# Patient Record
Sex: Female | Born: 1960 | Race: Black or African American | Hispanic: No | Marital: Married | Smoking: Never smoker
Health system: Southern US, Community
[De-identification: ages and names within clinical notes are randomized; demographics above are authoritative.]

## PROBLEM LIST (undated history)

## (undated) ENCOUNTER — Emergency Department (HOSPITAL_COMMUNITY): Discharge status: Absent without leave | Payer: Self-pay

## (undated) DIAGNOSIS — E039 Hypothyroidism, unspecified: Secondary | ICD-10-CM

## (undated) DIAGNOSIS — E119 Type 2 diabetes mellitus without complications: Secondary | ICD-10-CM

## (undated) DIAGNOSIS — I10 Essential (primary) hypertension: Secondary | ICD-10-CM

## (undated) DIAGNOSIS — K219 Gastro-esophageal reflux disease without esophagitis: Secondary | ICD-10-CM

## (undated) DIAGNOSIS — E079 Disorder of thyroid, unspecified: Secondary | ICD-10-CM

## (undated) DIAGNOSIS — E669 Obesity, unspecified: Secondary | ICD-10-CM

## (undated) DIAGNOSIS — E049 Nontoxic goiter, unspecified: Secondary | ICD-10-CM

## (undated) DIAGNOSIS — E785 Hyperlipidemia, unspecified: Secondary | ICD-10-CM

## (undated) DIAGNOSIS — E1169 Type 2 diabetes mellitus with other specified complication: Secondary | ICD-10-CM

## (undated) DIAGNOSIS — E1159 Type 2 diabetes mellitus with other circulatory complications: Secondary | ICD-10-CM

## (undated) DIAGNOSIS — I152 Hypertension secondary to endocrine disorders: Secondary | ICD-10-CM

## (undated) HISTORY — PX: THYROIDECTOMY: SHX17

## (undated) HISTORY — PX: RHINOPLASTY: SUR1284

## (undated) HISTORY — DX: Type 2 diabetes mellitus with other circulatory complications: E11.59

## (undated) HISTORY — DX: Hypertension secondary to endocrine disorders: I15.2

## (undated) HISTORY — DX: Hyperlipidemia, unspecified: E78.5

## (undated) HISTORY — DX: Obesity, unspecified: E66.9

## (undated) HISTORY — DX: Hypothyroidism, unspecified: E03.9

## (undated) HISTORY — DX: Essential (primary) hypertension: I10

## (undated) HISTORY — DX: Type 2 diabetes mellitus with other specified complication: E11.69

## (undated) HISTORY — PX: TUBAL LIGATION: SHX77

## (undated) HISTORY — PX: ABDOMINAL HYSTERECTOMY: SHX81

## (undated) HISTORY — DX: Gastro-esophageal reflux disease without esophagitis: K21.9

## (undated) HISTORY — DX: Nontoxic goiter, unspecified: E04.9

---

## 1998-08-25 ENCOUNTER — Other Ambulatory Visit: Admission: RE | Admit: 1998-08-25 | Discharge: 1998-08-25 | Payer: Self-pay | Admitting: *Deleted

## 1999-10-11 ENCOUNTER — Other Ambulatory Visit: Admission: RE | Admit: 1999-10-11 | Discharge: 1999-10-11 | Payer: Self-pay | Admitting: *Deleted

## 2000-03-14 ENCOUNTER — Inpatient Hospital Stay (HOSPITAL_COMMUNITY): Admission: AD | Admit: 2000-03-14 | Discharge: 2000-03-14 | Payer: Self-pay | Admitting: *Deleted

## 2004-04-11 ENCOUNTER — Ambulatory Visit: Payer: Self-pay | Admitting: Internal Medicine

## 2004-04-18 ENCOUNTER — Ambulatory Visit: Payer: Self-pay | Admitting: Internal Medicine

## 2004-04-21 ENCOUNTER — Other Ambulatory Visit: Admission: RE | Admit: 2004-04-21 | Discharge: 2004-04-21 | Payer: Self-pay | Admitting: Obstetrics and Gynecology

## 2004-05-18 ENCOUNTER — Inpatient Hospital Stay (HOSPITAL_COMMUNITY): Admission: RE | Admit: 2004-05-18 | Discharge: 2004-05-20 | Payer: Self-pay | Admitting: Obstetrics and Gynecology

## 2004-05-18 ENCOUNTER — Encounter (INDEPENDENT_AMBULATORY_CARE_PROVIDER_SITE_OTHER): Payer: Self-pay | Admitting: Specialist

## 2006-02-06 ENCOUNTER — Inpatient Hospital Stay (HOSPITAL_COMMUNITY): Admission: EM | Admit: 2006-02-06 | Discharge: 2006-02-08 | Payer: Self-pay | Admitting: Emergency Medicine

## 2007-05-02 ENCOUNTER — Ambulatory Visit: Payer: Self-pay | Admitting: Internal Medicine

## 2007-05-02 DIAGNOSIS — N951 Menopausal and female climacteric states: Secondary | ICD-10-CM | POA: Insufficient documentation

## 2007-05-02 DIAGNOSIS — F430 Acute stress reaction: Secondary | ICD-10-CM | POA: Insufficient documentation

## 2007-05-02 DIAGNOSIS — E785 Hyperlipidemia, unspecified: Secondary | ICD-10-CM | POA: Insufficient documentation

## 2007-05-02 DIAGNOSIS — E119 Type 2 diabetes mellitus without complications: Secondary | ICD-10-CM | POA: Insufficient documentation

## 2007-05-02 DIAGNOSIS — D259 Leiomyoma of uterus, unspecified: Secondary | ICD-10-CM | POA: Insufficient documentation

## 2007-05-17 ENCOUNTER — Ambulatory Visit: Payer: Self-pay | Admitting: Licensed Clinical Social Worker

## 2007-11-15 ENCOUNTER — Ambulatory Visit: Payer: Self-pay | Admitting: Licensed Clinical Social Worker

## 2007-11-21 ENCOUNTER — Ambulatory Visit: Payer: Self-pay | Admitting: Licensed Clinical Social Worker

## 2009-05-06 ENCOUNTER — Encounter: Admission: RE | Admit: 2009-05-06 | Discharge: 2009-05-06 | Payer: Self-pay | Admitting: Endocrinology

## 2009-05-24 ENCOUNTER — Encounter (HOSPITAL_COMMUNITY): Admission: RE | Admit: 2009-05-24 | Discharge: 2009-07-27 | Payer: Self-pay | Admitting: Endocrinology

## 2009-06-24 ENCOUNTER — Other Ambulatory Visit: Admission: RE | Admit: 2009-06-24 | Discharge: 2009-06-24 | Payer: Self-pay | Admitting: Interventional Radiology

## 2009-06-24 ENCOUNTER — Encounter: Admission: RE | Admit: 2009-06-24 | Discharge: 2009-06-24 | Payer: Self-pay | Admitting: Endocrinology

## 2010-04-29 ENCOUNTER — Other Ambulatory Visit: Payer: Self-pay | Admitting: Endocrinology

## 2010-04-29 DIAGNOSIS — E049 Nontoxic goiter, unspecified: Secondary | ICD-10-CM

## 2010-05-30 ENCOUNTER — Ambulatory Visit
Admission: RE | Admit: 2010-05-30 | Discharge: 2010-05-30 | Disposition: A | Discharge status: Home / Self Care | Payer: BC Managed Care – PPO | Source: Ambulatory Visit | Attending: Endocrinology | Admitting: Endocrinology

## 2010-05-30 DIAGNOSIS — E049 Nontoxic goiter, unspecified: Secondary | ICD-10-CM

## 2010-08-12 NOTE — H&P (Signed)
Brenda Fleming, Brenda Fleming               ACCOUNT NO.:  [id number]   MEDICAL RECORD NO.:  [id number]          PATIENT TYPE:  INP   LOCATION:  NA                            FACILITY:  [facility]   PHYSICIAN:  Zenaida Niece, M.D.DATE OF BIRTH:  01/22/1961   DATE OF ADMISSION:  05/18/2004  DATE OF DISCHARGE:                                HISTORY & PHYSICAL   CHIEF COMPLAINT:  Symptomatic leiomyomatous uterus.   HISTORY OF PRESENT ILLNESS:  This is a 50 year old black female, para 2-0-0-  2, whom I first saw on April 21, 2004.  At that time, she said that she  had menses every 1-2 months, which have recently been heavier with clotting  during her cycle.  The clots can be so big that they push out a super  tampon.  She is sexually active without problems, and has no other  significant symptoms, except some occasional pelvic pressure.  Pelvic exam  revealed an approximately 16 week size uterus.  Endometrial biopsy revealed  proliferative endometrium with a focus of simple hyperplasia without atypia.  Pap smear was normal.  All options were discussed with the patient, and she  wishes to proceed with definitive surgical therapy.   PAST OBSTETRICAL HISTORY:  Two vaginal deliveries at term without  complications, 9 pounds, 14 ounces, and 10 pound, 1 ounces.   PAST MEDICAL HISTORY:  Negative.   PAST SURGICAL HISTORY:  Tubal ligation.   ALLERGIES:  SULFA.   CURRENT MEDICATIONS:  1.  Over-the-counter sinus remedy.  2.  Aspirin occasional.  3.  Cold and Flu occasionally.  4.  Occasional vitamin.  5.  She has been on Aygestin 5 mg a day from the time I saw her until      surgery.   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   SOCIAL HISTORY:  She is married and denies alcohol, tobacco, or drug use.   FAMILY HISTORY:  The patient's mother had colon cancer at age 58.   REVIEW OF SYSTEMS:  Otherwise negative.   PHYSICAL EXAMINATION:   VITAL SIGNS:  Weight is 207 pounds, pulse 80, blood  pressure 148/90.  GENERAL:  This is a well-developed, well-nourished black female in no acute  distress.  NECK:  Supple without lymphadenopathy or thyromegaly.  HEART:  Regular rate and rhythm without murmur.  LUNGS:  Clear to auscultation.  ABDOMEN:  Soft, nontender, nondistended, with the uterine fundus palpable 2  fingerbreadths below the umbilicus.  EXTREMITIES:  Trace edema and are nontender.  PELVIC:  External genitalia have no lesions.  Speculum exam showed a normal  cervix.  Bimanual exam shows a 16-week size irregular nontender uterus  without adnexal masses.   ASSESSMENT:  Symptomatic leiomyomatous uterus.  All options were discussed  with the patient, and she wishes to proceed with definitive surgical  therapy.  Risks of surgery including bleeding, infection, and damage to the  surrounding organs have been discussed with the patient, and she  understands.  The plan is to admit the patient on the day  of surgery for an  abdominal hysterectomy.  The patient is to decide whether or not she wants  her ovaries removed.      TDM/MEDQ  D:  05/17/2004  T:  05/17/2004  Job:  [id number]

## 2010-08-12 NOTE — H&P (Signed)
Brenda Fleming, SILOS NO.:  [id number]   MEDICAL RECORD NO.:  [id number]          PATIENT TYPE:  INP   LOCATION:  5735                         FACILITY:  [facility]   PHYSICIAN:  Lucita Ferrara, MD         DATE OF BIRTH:  09-Sep-1960   DATE OF ADMISSION:  02/06/2006  DATE OF DISCHARGE:                                HISTORY & PHYSICAL   HISTORY OF PRESENT ILLNESS:  The patient is a 50 year old female with no  significant past medical history, who presents to [facility] Emergency Room  with chief complaint of not feeling well for the last few days.  Apparently  what happened is the patient presented to the [facility]  at [facility] on February 06, 2006 and had lab work done indicative of  diabetic ketoacidosis including CO2 that was 10, a sodium that was 129, a  glucose that was 373 with a gap.  Alkaline phosphatase was also 206.  The  patient was advised that she should go to the emergency room for evaluation  and treatment.  The patient's urinalysis also showed ketones and glucose,  1000 glucose and 80 mg/dL of ketones.  The patient says that she has been  urinating a lot and she has been having some blurry vision, yet she denies  any focal neurologic signs and she denies any chest pain or shortness of  breath.   PAST MEDICAL HISTORY:  As above, past medical history has been pretty much  none.   PAST SURGICAL HISTORY:  None.   SOCIAL HISTORY:  She denies any drugs.  She is a nonsmoker and a nondrinker.   ALLERGIES:  The patient is allergic to SULFA MEDICATIONS.   MEDICATIONS:  None.   REVIEW OF SYSTEMS:  As above.   PHYSICAL EXAMINATION:  VITAL SIGNS:  Blood pressure is 137/94, pulse is 108,  respirations 20.  Pulse oximetry is 99% on room air.  GENERAL:  Generally speaking, the patient is in no acute distress.  HEENT:  Normocephalic, atraumatic.  Sclerae anicteric.  NECK:  No JVD.  No carotid bruits.  CARDIOVASCULAR:  S1 and S2.  Regular  rate and rhythm.  No murmurs, rubs or  clicks.  ABDOMEN:  Soft, nontender and non-distended.  Positive bowel sounds.  LUNGS:  Clear to auscultation bilaterally.  No rhonchi, no wheezes, no  rales.  EXTREMITIES:  No clubbing, cyanosis or edema.  NEUROLOGIC:  Alert and oriented x3.  Cranial nerves II-XII grossly intact.   LABORATORY DATA:  Glucose was greater than 1000, ketones greater than 80.  White count 6.4, hemoglobin 15.4, hematocrit 45.9, platelet count was  288,000.   EMERGENCY ROOM COURSE:  The patient was started on bolus of IV fluids and  was also given 10 units of NovoLog in the emergency room.   EKG was normal sinus rhythm, no ST-T wave changes.   ASSESSMENT AND PLAN:  This is a 50 year old female, a new-onset diabetic.  We are going to go ahead and admit her for high blood sugars and diabetic  ketoacidosis.  She is  very, very stable.  I am going to go ahead and get  arterial blood gases on her, start her on intravenous fluids and put her on  sliding-scale insulin.  In addition, I would like to get magnesium and  phosphorus and repeat her basic metabolic panel including potassium in the  next 3 or 4 hours to make sure that her potassium is within the normal  range.  We will repeat electrolytes as needed.  The patient will need some  diabetic teaching.  I have explained the hospital course and procedures to  the patient and the patient understands.      Lucita Ferrara, MD  Electronically Signed     RR/MEDQ  D:  02/07/2006  T:  02/07/2006  Job:  [id number]

## 2010-08-12 NOTE — Op Note (Signed)
NAMESHI, GROSE               ACCOUNT NO.:  [id number]   MEDICAL RECORD NO.:  [id number]          PATIENT TYPE:  INP   LOCATION:  9399                          FACILITY:  [facility]   PHYSICIAN:  Zenaida Niece, M.D.DATE OF BIRTH:  [birth year]   DATE OF PROCEDURE:  05/18/2004  DATE OF DISCHARGE:                                 OPERATIVE REPORT   PREOPERATIVE DIAGNOSIS:  Symptomatic leiomyomatous uterus.   POSTOPERATIVE DIAGNOSIS:  Symptomatic leiomyomatous uterus.   PROCEDURE:  Total abdominal hysterectomy with bilateral salpingo-  oophorectomy.   SURGEON:  Zenaida Niece, M.D.   ASSISTANT:  Malachi Pro. Ambrose Mantle, M.D.   ANESTHESIA:  General endotracheal tube.   SPECIMENS:  Uterus, tubes and ovaries.   ESTIMATED BLOOD LOSS:  200 mL.   FINDINGS:  A 14 week-size uterus with a simple right ovarian cyst.   PROCEDURE IN DETAIL:  The patient was taken to the operating room, placed on  dorsal supine position. General anesthesia was induced. Abdomen and vagina  were prepped and draped in the usual sterile fashion and Foley catheter  inserted. Abdomen was then entered via standard Pfannenstiel incision and a  self-retaining retractor was placed and the bowels packed out of the pelvis.  The uterus was noted to be 14 weeks size and freely mobile. Uterine cornu  were grasped with a large Kelly clamp. The round ligaments were divided with  electrocautery and the anterior lip of the broad ligament was dissected  across the anterior portion of the uterus and bladder pushed inferior. On  each side a window was made in an avascular portion of the broad ligament  and the infundibulopelvic ligament was clamped, transected and doubly  ligated with #1 chromic. Uterine arteries were then skeletonized and clamped  with Zeppelin clamps and transected and ligated with #1 chromic. Bladder was  dissected inferior and cardinal ligaments, uterosacral ligaments were  clamped, transected and ligated on  each side. Uterosacral ligaments were  tagged for later use. Several bites were required on the patient's right  side to get to the vaginal angle. The vagina was then entered and the uterus  with tubes and ovaries attached and cervix intact was removed sharply.  Vaginal angles were sutured with #1 chromic and tagged for later use. The  remainder of the vagina was closed with figure-of-eight sutures of #1  chromic. All pedicles were inspected and found to be hemostatic. Ureters  were palpated well below suture lines. Small amount of bleeders from serosal  edges was controlled with electrocautery. Uterosacral ligaments were  plicated in the midline with 2-0 silk. In controlling one of the serosal  bleeders, there was a loop of bowel in the field and this was very  superficially touched with electrocautery. This area was oversewn with a  suture of 3-0 silk. There was very minimal damage to the bowel. The pelvis  was irrigated and found to be hemostatic. The pack was removed followed by a  self-retaining retractor. Her peritoneum and rectus muscles were then closed  with interrupted sutures of #1 chromic due to omentum and bowel prolapsing  through  the incision. Subfascial space was irrigated and found to be  hemostatic. Fascia was closed in running fashion starting at both ends and  meeting in the middle with 0 Vicryl. Subcutaneous tissue was irrigated and  made hemostatic with electrocautery. Subcutaneous tissues then closed with  2-0 plain gut suture on a large needle. Skin was closed with staples. The  patient tolerated the procedure well and was taken to recovery in stable  condition. Counts were correct x2, she received Ancef 1 g  prior to the  procedure, she had PAS hose on throughout the procedure.      TDM/MEDQ  D:  05/18/2004  T:  05/18/2004  Job:  [id number]

## 2010-08-12 NOTE — Discharge Summary (Signed)
Brenda Fleming, SPENGLER               ACCOUNT NO.:  [id number]   MEDICAL RECORD NO.:  [id number]          PATIENT TYPE:  INP   LOCATION:  9320                          FACILITY:  [facility]   PHYSICIAN:  Zenaida Niece, M.D.DATE OF BIRTH:  05-06-1960   DATE OF ADMISSION:  05/18/2004  DATE OF DISCHARGE:                                 DISCHARGE SUMMARY   ADMISSION DIAGNOSIS:  Symptomatic leiomyomatous uterus.   DISCHARGE DIAGNOSIS:  Symptomatic leiomyomatous uterus.   PROCEDURES:  On May 18, 2004 she had a total abdominal hysterectomy  with bilateral salpingo-oophorectomy.   COMPLICATIONS:  None.   HISTORY AND PHYSICAL:  Please see chart for full history and physical but  briefly, this is a 50 year old black female para 2-0-0-2 with heavy menses  every 1-2 months with heavy clots. She had an enlarged myomatous uterus by  ultrasound and a benign endometrial biopsy with some simple hyperplasia. The  patient is admitted for definitive surgical therapy. Physical exam  significant for a benign abdomen with the uterine fundus palpable two  fingerbreadths below the umbilicus and on pelvic exam, external genitalia  and cervix and vagina were normal. On bimanual exam she had a 16-week-size  irregular uterus without adnexal masses.   HOSPITAL COURSE:  On February 22, she was taken to the operating room and  had a TAH/BSO under general anesthesia. Estimated blood loss was 200 mL.  Postoperatively, she had no significant complications. Preoperative  hemoglobin 10.8, postoperative 10.7. On postoperative day #2 she was  tolerating a diet, ambulating, and felt to be stable enough for discharge  home.   DISCHARGE INSTRUCTIONS:  Regular diet, pelvic rest, no strenuous activity.  Follow-up is in 3-4 days for staple removal. Medications are Percocet #40  one to two p.o. q.4-6h. p.r.n. pain and over-the-counter ibuprofen as  needed.      TDM/MEDQ  D:  05/20/2004  T:  05/20/2004  Job:  [id number]

## 2010-12-01 ENCOUNTER — Other Ambulatory Visit: Payer: Self-pay | Admitting: Endocrinology

## 2010-12-01 DIAGNOSIS — E049 Nontoxic goiter, unspecified: Secondary | ICD-10-CM

## 2010-12-15 ENCOUNTER — Ambulatory Visit
Admission: RE | Admit: 2010-12-15 | Discharge: 2010-12-15 | Disposition: A | Discharge status: Home / Self Care | Payer: BC Managed Care – PPO | Source: Ambulatory Visit | Attending: Endocrinology | Admitting: Endocrinology

## 2010-12-15 DIAGNOSIS — E049 Nontoxic goiter, unspecified: Secondary | ICD-10-CM

## 2011-07-05 ENCOUNTER — Other Ambulatory Visit: Payer: Self-pay | Admitting: Endocrinology

## 2011-07-05 DIAGNOSIS — E049 Nontoxic goiter, unspecified: Secondary | ICD-10-CM

## 2011-07-11 ENCOUNTER — Other Ambulatory Visit: Payer: BC Managed Care – PPO

## 2011-07-18 ENCOUNTER — Ambulatory Visit
Admission: RE | Admit: 2011-07-18 | Discharge: 2011-07-18 | Disposition: A | Discharge status: Home / Self Care | Payer: BC Managed Care – PPO | Source: Ambulatory Visit | Attending: Endocrinology | Admitting: Endocrinology

## 2011-07-18 DIAGNOSIS — E049 Nontoxic goiter, unspecified: Secondary | ICD-10-CM

## 2012-02-02 ENCOUNTER — Encounter (HOSPITAL_COMMUNITY): Payer: Self-pay

## 2012-02-02 ENCOUNTER — Inpatient Hospital Stay (HOSPITAL_COMMUNITY)
Admission: EM | Admit: 2012-02-02 | Discharge: 2012-02-04 | DRG: 294 | Disposition: A | Discharge status: Home / Self Care | Payer: BC Managed Care – PPO | Attending: Internal Medicine | Admitting: Internal Medicine

## 2012-02-02 DIAGNOSIS — E039 Hypothyroidism, unspecified: Secondary | ICD-10-CM | POA: Diagnosis present

## 2012-02-02 DIAGNOSIS — E119 Type 2 diabetes mellitus without complications: Secondary | ICD-10-CM | POA: Diagnosis present

## 2012-02-02 DIAGNOSIS — E049 Nontoxic goiter, unspecified: Secondary | ICD-10-CM | POA: Diagnosis present

## 2012-02-02 DIAGNOSIS — E1110 Type 2 diabetes mellitus with ketoacidosis without coma: Secondary | ICD-10-CM | POA: Diagnosis present

## 2012-02-02 DIAGNOSIS — F430 Acute stress reaction: Secondary | ICD-10-CM

## 2012-02-02 DIAGNOSIS — Z9119 Patient's noncompliance with other medical treatment and regimen: Secondary | ICD-10-CM

## 2012-02-02 DIAGNOSIS — E785 Hyperlipidemia, unspecified: Secondary | ICD-10-CM | POA: Diagnosis present

## 2012-02-02 DIAGNOSIS — Z91199 Patient's noncompliance with other medical treatment and regimen due to unspecified reason: Secondary | ICD-10-CM

## 2012-02-02 DIAGNOSIS — Z79899 Other long term (current) drug therapy: Secondary | ICD-10-CM

## 2012-02-02 DIAGNOSIS — E1310 Other specified diabetes mellitus with ketoacidosis without coma: Principal | ICD-10-CM | POA: Diagnosis present

## 2012-02-02 DIAGNOSIS — D259 Leiomyoma of uterus, unspecified: Secondary | ICD-10-CM

## 2012-02-02 DIAGNOSIS — N951 Menopausal and female climacteric states: Secondary | ICD-10-CM

## 2012-02-02 DIAGNOSIS — Z7982 Long term (current) use of aspirin: Secondary | ICD-10-CM

## 2012-02-02 HISTORY — DX: Disorder of thyroid, unspecified: E07.9

## 2012-02-02 HISTORY — DX: Type 2 diabetes mellitus without complications: E11.9

## 2012-02-02 LAB — URINALYSIS, ROUTINE W REFLEX MICROSCOPIC
Bilirubin Urine: NEGATIVE
Glucose, UA: >1000 mg/dL — AB
Hgb urine dipstick: NEGATIVE
Ketones, ur: >80 mg/dL — AB
Leukocytes, UA: NEGATIVE
Nitrite: NEGATIVE
Protein, ur: NEGATIVE mg/dL
Specific Gravity, Urine: 1.036 — ABNORMAL HIGH (ref 1.005–1.030)
Urobilinogen, UA: 0.2 mg/dL (ref 0.0–1.0)
pH: 5 (ref 5.0–8.0)

## 2012-02-02 LAB — BASIC METABOLIC PANEL
BUN: 13 mg/dL (ref 6–23)
CO2: 19 mEq/L (ref 19–32)
Calcium: 9.5 mg/dL (ref 8.4–10.5)
Chloride: 91 mEq/L — ABNORMAL LOW (ref 96–112)
Creatinine, Ser: 0.63 mg/dL (ref 0.50–1.10)
GFR calc Af Amer: >90 mL/min (ref >90)
GFR calc non Af Amer: >90 mL/min (ref >90)
Sodium: 128 mEq/L — ABNORMAL LOW (ref 135–145)

## 2012-02-02 LAB — CBC
HCT: 40.7 % (ref 36.0–46.0)
Hemoglobin: 13.9 g/dL (ref 12.0–15.0)
MCH: 24.8 pg — ABNORMAL LOW (ref 26.0–34.0)
MCHC: 34.2 g/dL (ref 30.0–36.0)
MCV: 72.5 fL — ABNORMAL LOW (ref 78.0–100.0)
Platelets: 278 10*3/uL (ref 150–400)
RBC: 5.61 MIL/uL — ABNORMAL HIGH (ref 3.87–5.11)
RDW: 13.7 % (ref 11.5–15.5)
WBC: 6.9 10*3/uL (ref 4.0–10.5)

## 2012-02-02 LAB — GLUCOSE, CAPILLARY
Glucose-Capillary: 386 mg/dL — ABNORMAL HIGH (ref 70–99)
Glucose-Capillary: 316 mg/dL — ABNORMAL HIGH (ref 70–99)

## 2012-02-02 NOTE — ED Notes (Addendum)
Pt has been seeing PMD Q35months.  Last HgbA1c was fine around 6 months ago so stopped taking CBG's (she is DM 2).  Has been having leg cramps w/30lb weight loss since end of August of this year.  Thought she had lost appetite d/t being on allergy/asthma meds.  Her leg cramps have been worsening, she has been drinking vitamin waters to increase her potassium levels to help.  She took her CBG last night & states it was 580 then.  She took 2 metformin tabs and dropped CBG to 300's.  Today it was up to 511 around 430pm. Has had a total of 5, 500mg  metformin tabs today.  Currently CBG 386

## 2012-02-03 ENCOUNTER — Inpatient Hospital Stay (HOSPITAL_COMMUNITY): Payer: BC Managed Care – PPO

## 2012-02-03 DIAGNOSIS — E049 Nontoxic goiter, unspecified: Secondary | ICD-10-CM | POA: Diagnosis present

## 2012-02-03 DIAGNOSIS — E1110 Type 2 diabetes mellitus with ketoacidosis without coma: Secondary | ICD-10-CM | POA: Diagnosis present

## 2012-02-03 DIAGNOSIS — E119 Type 2 diabetes mellitus without complications: Secondary | ICD-10-CM

## 2012-02-03 LAB — GLUCOSE, CAPILLARY
Glucose-Capillary: 223 mg/dL — ABNORMAL HIGH (ref 70–99)
Glucose-Capillary: 107 mg/dL — ABNORMAL HIGH (ref 70–99)
Glucose-Capillary: 323 mg/dL — ABNORMAL HIGH (ref 70–99)
Glucose-Capillary: 284 mg/dL — ABNORMAL HIGH (ref 70–99)

## 2012-02-03 LAB — MAGNESIUM: Magnesium: 1.8 mg/dL (ref 1.5–2.5)

## 2012-02-03 LAB — BASIC METABOLIC PANEL
CO2: 20 mEq/L (ref 19–32)
Calcium: 8.2 mg/dL — ABNORMAL LOW (ref 8.4–10.5)
Creatinine, Ser: 0.56 mg/dL (ref 0.50–1.10)
Glucose, Bld: 179 mg/dL — ABNORMAL HIGH (ref 70–99)
Sodium: 134 mEq/L — ABNORMAL LOW (ref 135–145)

## 2012-02-03 LAB — CBC
MCH: 24.3 pg — ABNORMAL LOW (ref 26.0–34.0)
MCV: 73.9 fL — ABNORMAL LOW (ref 78.0–100.0)
Platelets: 236 10*3/uL (ref 150–400)
RBC: 4.94 MIL/uL (ref 3.87–5.11)

## 2012-02-03 LAB — HEMOGLOBIN A1C
Hgb A1c MFr Bld: 16.3 % — ABNORMAL HIGH (ref <5.7)
Mean Plasma Glucose: 421 mg/dL — ABNORMAL HIGH (ref <117)

## 2012-02-03 MED ORDER — SODIUM CHLORIDE 0.9 % IV SOLN
INTRAVENOUS | Status: DC
  Administered 2012-02-03: via INTRAVENOUS

## 2012-02-03 MED ORDER — SODIUM CHLORIDE 0.9 % IV SOLN
INTRAVENOUS | Status: DC
Start: 2012-02-03 — End: 2012-02-03

## 2012-02-03 MED ORDER — ENOXAPARIN SODIUM 40 MG/0.4ML ~~LOC~~ SOLN: 40.0000 mg | SUBCUTANEOUS | Status: DC

## 2012-02-03 MED ORDER — ASPIRIN EC 325 MG PO TBEC
325.0000 mg | DELAYED_RELEASE_TABLET | Freq: Every day | ORAL | Status: DC
  Administered 2012-02-03: 325 mg via ORAL
  Filled 2012-02-03 (×2): qty 1

## 2012-02-03 MED ORDER — LIVING WELL WITH DIABETES BOOK
Freq: Once | Status: AC
  Administered 2012-02-04: 10:00:00
  Filled 2012-02-03: qty 1

## 2012-02-03 MED ORDER — ACETAMINOPHEN 650 MG RE SUPP: 650.0000 mg | Freq: Four times a day (QID) | RECTAL | Status: DC | PRN

## 2012-02-03 MED ORDER — SODIUM CHLORIDE 0.9 % IV SOLN: INTRAVENOUS | Status: AC

## 2012-02-03 MED ORDER — ONDANSETRON HCL 4 MG/2ML IJ SOLN: 4.0000 mg | Freq: Four times a day (QID) | INTRAMUSCULAR | Status: DC | PRN

## 2012-02-03 MED ORDER — ALUM & MAG HYDROXIDE-SIMETH 200-200-20 MG/5ML PO SUSP: 30.0000 mL | Freq: Four times a day (QID) | ORAL | Status: DC | PRN

## 2012-02-03 MED ORDER — ADULT MULTIVITAMIN W/MINERALS CH
1.0000 | ORAL_TABLET | Freq: Every day | ORAL | Status: DC
  Administered 2012-02-03 – 2012-02-04 (×2): 1 via ORAL
  Filled 2012-02-03 (×2): qty 1

## 2012-02-03 MED ORDER — DEXTROSE-NACL 5-0.45 % IV SOLN
INTRAVENOUS | Status: DC
  Administered 2012-02-03: 03:00:00 via INTRAVENOUS

## 2012-02-03 MED ORDER — SODIUM CHLORIDE 0.9 % IV SOLN
INTRAVENOUS | Status: DC
  Filled 2012-02-03: qty 1

## 2012-02-03 MED ORDER — ENOXAPARIN SODIUM 40 MG/0.4ML ~~LOC~~ SOLN
40.0000 mg | SUBCUTANEOUS | Status: DC
  Administered 2012-02-03 – 2012-02-04 (×2): 40 mg via SUBCUTANEOUS
  Filled 2012-02-03 (×2): qty 0.4

## 2012-02-03 MED ORDER — ACETAMINOPHEN 325 MG PO TABS: 650.0000 mg | ORAL_TABLET | Freq: Four times a day (QID) | ORAL | Status: DC | PRN

## 2012-02-03 MED ORDER — SODIUM CHLORIDE 0.9 % IV SOLN
INTRAVENOUS | Status: DC
  Administered 2012-02-03: 02:00:00 via INTRAVENOUS

## 2012-02-03 MED ORDER — INSULIN GLARGINE 100 UNIT/ML ~~LOC~~ SOLN
6.0000 [IU] | Freq: Every day | SUBCUTANEOUS | Status: DC
  Administered 2012-02-03: 6 [IU] via SUBCUTANEOUS

## 2012-02-03 MED ORDER — ZOLPIDEM TARTRATE 5 MG PO TABS: 5.0000 mg | ORAL_TABLET | Freq: Every evening | ORAL | Status: DC | PRN

## 2012-02-03 MED ORDER — SODIUM CHLORIDE 0.9 % IV SOLN: INTRAVENOUS | Status: DC

## 2012-02-03 MED ORDER — DEXTROSE 50 % IV SOLN: 25.0000 mL | INTRAVENOUS | Status: DC | PRN

## 2012-02-03 MED ORDER — ATORVASTATIN CALCIUM 40 MG PO TABS
40.0000 mg | ORAL_TABLET | Freq: Every day | ORAL | Status: DC
  Administered 2012-02-03 – 2012-02-04 (×2): 40 mg via ORAL
  Filled 2012-02-03 (×2): qty 1

## 2012-02-03 MED ORDER — HYDROCODONE-ACETAMINOPHEN 5-325 MG PO TABS: 1.0000 | ORAL_TABLET | ORAL | Status: DC | PRN

## 2012-02-03 MED ORDER — DEXTROSE-NACL 5-0.45 % IV SOLN: INTRAVENOUS | Status: DC

## 2012-02-03 MED ORDER — INSULIN GLARGINE 100 UNIT/ML ~~LOC~~ SOLN: 6.0000 [IU] | Freq: Every day | SUBCUTANEOUS | Status: DC

## 2012-02-03 MED ORDER — INSULIN ASPART 100 UNIT/ML ~~LOC~~ SOLN
0.0000 [IU] | Freq: Three times a day (TID) | SUBCUTANEOUS | Status: DC
  Administered 2012-02-03 – 2012-02-04 (×3): 8 [IU] via SUBCUTANEOUS
  Administered 2012-02-04: 15 [IU] via SUBCUTANEOUS

## 2012-02-03 MED ORDER — LISINOPRIL 10 MG PO TABS
10.0000 mg | ORAL_TABLET | Freq: Every day | ORAL | Status: DC
  Administered 2012-02-03 – 2012-02-04 (×2): 10 mg via ORAL
  Filled 2012-02-03 (×2): qty 1

## 2012-02-03 MED ORDER — INSULIN GLARGINE 100 UNIT/ML ~~LOC~~ SOLN
6.0000 [IU] | SUBCUTANEOUS | Status: AC
  Administered 2012-02-03: 6 [IU] via SUBCUTANEOUS

## 2012-02-03 MED ORDER — MORPHINE SULFATE 2 MG/ML IJ SOLN: 1.0000 mg | INTRAMUSCULAR | Status: DC | PRN

## 2012-02-03 MED ORDER — SODIUM CHLORIDE 0.9 % IV SOLN
INTRAVENOUS | Status: DC
  Administered 2012-02-03: 2.1 [IU]/h via INTRAVENOUS
  Filled 2012-02-03: qty 1

## 2012-02-03 MED ORDER — ONDANSETRON HCL 4 MG PO TABS: 4.0000 mg | ORAL_TABLET | Freq: Four times a day (QID) | ORAL | Status: DC | PRN

## 2012-02-03 MED ORDER — INSULIN REGULAR BOLUS VIA INFUSION
0.0000 [IU] | Freq: Three times a day (TID) | INTRAVENOUS | Status: DC
  Administered 2012-02-03: 7.2 [IU] via INTRAVENOUS
  Filled 2012-02-03: qty 10

## 2012-02-03 NOTE — ED Provider Notes (Signed)
History     CSN: [id number]  Arrival date & time 02/02/12  1813   First MD Initiated Contact with Patient 02/02/12 2306      Chief Complaint  Patient presents with  . Hyperglycemia  . leg cramps     (Consider location/radiation/quality/duration/timing/severity/associated sxs/prior treatment) Patient is a 51 y.o. female presenting with diabetes problem. The history is provided by the patient. No language interpreter was used.  Diabetes She has type 2 diabetes mellitus. No MedicAlert identification noted. Her disease course has been worsening. Pertinent negatives for hypoglycemia include no sleepiness. Associated symptoms include polydipsia and weight loss. Pertinent negatives for diabetes include no blurred vision, no chest pain and no fatigue. Pertinent negatives for hypoglycemia complications include no hospitalization. Symptoms are worsening. Pertinent negatives for diabetic complications include no CVA. Risk factors for coronary artery disease include diabetes mellitus. Current diabetic treatment includes oral agent (monotherapy). She is compliant with treatment all of the time. She is following a generally healthy diet. When asked about meal planning, she reported none. Home blood sugar record trend: unknown has not checked it since may. An ACE inhibitor/angiotensin II receptor blocker is being taken.    Past Medical History  Diagnosis Date  . Diabetes mellitus without complication     Type 2  . Thyroid disease     hypothyroid    Past Surgical History  Procedure Date  . Abdominal hysterectomy   . Tubal ligation     History reviewed. No pertinent family history.  History  Substance Use Topics  . Smoking status: Never Smoker   . Smokeless tobacco: Not on file  . Alcohol Use: Yes     Comment: very occasionally    OB History    Grav Para Term Preterm Abortions TAB SAB Ect Mult Living                  Review of Systems  Constitutional: Positive for weight loss.  Negative for fatigue.  Eyes: Negative for blurred vision.  Respiratory: Negative for shortness of breath.   Cardiovascular: Negative for chest pain and leg swelling.  Genitourinary: Negative for dysuria.  Hematological: Positive for polydipsia.  All other systems reviewed and are negative.    Allergies  Sulfonamide derivatives  Home Medications   Current Outpatient Rx  Name  Route  Sig  Dispense  Refill  . ASPIRIN 325 MG PO TBEC   Oral   Take 325 mg by mouth at bedtime.         . ATORVASTATIN CALCIUM 40 MG PO TABS   Oral   Take 40 mg by mouth daily.         Marland Kitchen CALCIUM & MAGNESIUM CARBONATES 311-232 MG PO TABS   Oral   Take 1 tablet by mouth daily.         . OMEGA-3 FATTY ACIDS 1000 MG PO CAPS   Oral   Take 1 g by mouth daily.         Marland Kitchen LISINOPRIL 10 MG PO TABS   Oral   Take 10 mg by mouth daily.         Marland Kitchen MELATONIN 3 MG PO TABS   Oral   Take 1 tablet by mouth at bedtime.         Marland Kitchen METFORMIN HCL ER (OSM) 500 MG PO TB24   Oral   Take 500 mg by mouth 2 (two) times daily with a meal.         . ADULT MULTIVITAMIN W/MINERALS  CH   Oral   Take 1 tablet by mouth daily.         Marland Kitchen POTASSIUM 99 MG PO TABS   Oral   Take 2 tablets by mouth 2 (two) times daily.           BP 110/72  Pulse 83  Temp 99 F (37.2 C) (Oral)  Resp 16  SpO2 98%  Physical Exam  Constitutional: She is oriented to person, place, and time. She appears well-developed and well-nourished. No distress.  HENT:  Head: Normocephalic and atraumatic.  Mouth/Throat: Oropharynx is clear and moist.  Eyes: Conjunctivae normal are normal. Pupils are equal, round, and reactive to light.  Neck: Normal range of motion. Neck supple.  Cardiovascular: Normal rate and regular rhythm.   Pulmonary/Chest: Effort normal and breath sounds normal.  Abdominal: Soft. Bowel sounds are normal. There is no tenderness. There is no rebound and no guarding.  Musculoskeletal: Normal range of motion.    Neurological: She is alert and oriented to person, place, and time.  Skin: Skin is warm and dry.  Psychiatric: She has a normal mood and affect.    ED Course  Procedures (including critical care time)  Labs Reviewed  GLUCOSE, CAPILLARY - Abnormal; Notable for the following:    Glucose-Capillary 386 (*)     All other components within normal limits  CBC - Abnormal; Notable for the following:    RBC 5.61 (*)     MCV 72.5 (*)     MCH 24.8 (*)     All other components within normal limits  BASIC METABOLIC PANEL - Abnormal; Notable for the following:    Sodium 128 (*)     Potassium 5.2 (*)  HEMOLYSIS AT THIS LEVEL MAY AFFECT RESULT   Chloride 91 (*)     Glucose, Bld 338 (*)     All other components within normal limits  URINALYSIS, ROUTINE W REFLEX MICROSCOPIC - Abnormal; Notable for the following:    Specific Gravity, Urine 1.036 (*)     Glucose, UA >1000 (*)     Ketones, ur >80 (*)     All other components within normal limits  GLUCOSE, CAPILLARY - Abnormal; Notable for the following:    Glucose-Capillary 316 (*)     All other components within normal limits  GLUCOSE, CAPILLARY - Abnormal; Notable for the following:    Glucose-Capillary 262 (*)     All other components within normal limits  URINE MICROSCOPIC-ADD ON  HEMOGLOBIN A1C   No results found.   No diagnosis found.    MDM  DKA will need admission, started on boluses and glucose stabilizer.  Labs reviewed case d/w hospitalist  MDM Reviewed: nursing note and vitals Interpretation: labs and x-ray Total time providing critical care: 30-74 minutes. This excludes time spent performing separately reportable procedures and services. Consults: admitting MD  CRITICAL CARE Performed by: Jasmine Awe   Total critical care time: 30 minutes  Critical care time was exclusive of separately billable procedures and treating other patients.  Critical care was necessary to treat or prevent imminent or  life-threatening deterioration.  Critical care was time spent personally by me on the following activities: development of treatment plan with patient and/or surrogate as well as nursing, discussions with consultants, evaluation of patient's response to treatment, examination of patient, obtaining history from patient or surrogate, ordering and performing treatments and interventions, ordering and review of laboratory studies, ordering and review of radiographic studies, pulse oximetry and re-evaluation of patient's  condition.        Jasmine Awe, MD 02/03/12 [phone]

## 2012-02-03 NOTE — Progress Notes (Signed)
Patient admitted this morning with mild DKA. Her anion gap has closed. She was weaned off the glucose stabilizer and started on Lantus and sliding scale insulin. Patient is tolerating diet well. Plan is to discharge tomorrow to follow endocrinologist Dr. Talmage Nap.

## 2012-02-03 NOTE — Progress Notes (Signed)
[facility] paged in reference to patient's blood sugars trending up. Awaiting call back.

## 2012-02-03 NOTE — H&P (Signed)
[facility]                                                                                    Patient Demographics  Brenda Fleming, is a 51 y.o. female  CSN: [id number]  MRN: [id number]  DOB - 1960-07-29  Admit Date - 02/02/2012  Outpatient Primary MD for the patient is Lorretta Harp, MD   With History of -  Past Medical History  Diagnosis Date  . Diabetes mellitus without complication     Type 2  . Thyroid disease     hypothyroid      Past Surgical History  Procedure Date  . Abdominal hysterectomy   . Tubal ligation     in for   Chief Complaint  Patient presents with  . Hyperglycemia  . leg cramps      HPI  Brenda Fleming  is a 51 y.o. female, with past medical history significant for diabetes mellitus with one episode of DKA around 7 years ago for who presented today to the emergency room for evaluation of an elevated blood sugar. The patient reports that for the last few days she's been having leg cramps. She hasn't been checking her blood sugar very well in the last few weeks and even months also her last hemoglobin A1c was very good 6 months ago she's been having polydipsia and she's been losing weight he    Review of Systems    In addition to the HPI above,  No Fever-chills, No Headache, No changes with Vision or hearing, No problems swallowing food or Liquids, No Chest pain, Cough or Shortness of Breath, No Abdominal pain, No Nausea or Vommitting, Bowel movements are regular, No Blood in stool or Urine, No dysuria, No new skin rashes or bruises, No new joints pains-aches,  No new weakness, tingling, numbness in any extremity, No recent weight gain or loss, No polyuria, polydypsia or polyphagia, No significant Mental Stressors.  A full 10 point Review of Systems was done, except as stated above, all other Review of Systems were negative.   Social History History  Substance Use Topics  . Smoking status: Never Smoker   .  Smokeless tobacco: Not on file  . Alcohol Use: Yes     Comment: very occasionally     Family History History reviewed. No pertinent family history.  Prior to Admission medications   Medication Sig Start Date End Date Taking? Authorizing Provider  aspirin 325 MG EC tablet Take 325 mg by mouth at bedtime.   Yes Historical Provider, MD  atorvastatin (LIPITOR) 40 MG tablet Take 40 mg by mouth daily.   Yes Historical Provider, MD  calcium & magnesium carbonates (MYLANTA) 311-232 MG per tablet Take 1 tablet by mouth daily.   Yes Historical Provider, MD  fish oil-omega-3 fatty acids 1000 MG capsule Take 1 g by mouth daily.   Yes Historical Provider, MD  lisinopril (PRINIVIL,ZESTRIL) 10 MG tablet Take 10 mg by mouth daily.   Yes Historical Provider, MD  Melatonin 3 MG TABS Take 1 tablet by mouth at bedtime.   Yes Historical Provider, MD  metformin (FORTAMET) 500 MG (OSM) 24 hr tablet Take 500  mg by mouth 2 (two) times daily with a meal.   Yes Historical Provider, MD  Multiple Vitamin (MULTIVITAMIN WITH MINERALS) TABS Take 1 tablet by mouth daily.   Yes Historical Provider, MD  Potassium 99 MG TABS Take 2 tablets by mouth 2 (two) times daily.   Yes Historical Provider, MD    Allergies  Allergen Reactions  . Sulfonamide Derivatives     unknown    Physical Exam  Vitals  Blood pressure 110/72, pulse 83, temperature 99 F (37.2 C), temperature source Oral, resp. rate 16, SpO2 98.00%.   1. General [place] American female in no acute distress lying in bed. Very pleasant  2. Normal affect and insight, Not Suicidal or Homicidal, Awake Alert, Oriented X 3.  3. No F.N deficits, ALL C.Nerves Intact, Strength 5/5 all 4 extremities, Sensation intact all 4 extremities, Plantars down going.  4. Ears and Eyes appear Normal, Conjunctivae clear, PERRLA. Moist Oral Mucosa.  5. Supple Neck, No JVD, No cervical lymphadenopathy appriciated, No Carotid Bruits. Thyroid gland is enlarged  6.  Symmetrical Chest wall movement, Good air movement bilaterally, CTAB.  7. RRR, No Gallops, Rubs or Murmurs, No Parasternal Heave.  8. Positive Bowel Sounds, Abdomen Soft, Non tender, No organomegaly appriciated,No rebound -guarding or rigidity.  9.  No Cyanosis, Normal Skin Turgor, No Skin Rash or Bruise.  10. Good muscle tone,  joints appear normal , no effusions, Normal ROM.  11. No Palpable Lymph Nodes in Neck or Axillae    Data Review  CBC  Lab 02/02/12 2024  WBC 6.9  HGB 13.9  HCT 40.7  PLT 278  MCV 72.5*  MCH 24.8*  MCHC 34.2  RDW 13.7  LYMPHSABS --  MONOABS --  EOSABS --  BASOSABS --  BANDABS --   ------------------------------------------------------------------------------------------------------------------  Chemistries   Lab 02/02/12 2024  NA 128*  K 5.2*  CL 91*  CO2 19  GLUCOSE 338*  BUN 13  CREATININE 0.63  CALCIUM 9.5  MG --  AST --  ALT --  ALKPHOS --  BILITOT --   ------------------------------------------------------------------------------------------------------------------   ---------------------------------------------------------------------------------------------------------------  Urinalysis    Component Value Date/Time   COLORURINE YELLOW 02/02/2012 2125   APPEARANCEUR CLEAR 02/02/2012 2125   LABSPEC 1.036* 02/02/2012 2125   PHURINE 5.0 02/02/2012 2125   GLUCOSEU >1000* 02/02/2012 2125   HGBUR NEGATIVE 02/02/2012 2125   BILIRUBINUR NEGATIVE 02/02/2012 2125   KETONESUR >80* 02/02/2012 2125   PROTEINUR NEGATIVE 02/02/2012 2125   UROBILINOGEN 0.2 02/02/2012 2125   NITRITE NEGATIVE 02/02/2012 2125   LEUKOCYTESUR NEGATIVE 02/02/2012 2125    ----------------------------------------------------------------------------------------------------------------  ABG    Imaging results:   No results found.   Assessment & Plan    A) 1. DKA/mild; patient noncompliant 2. Thyroid enlargement patient is having workup as  outpatient Plan IV fluids IV insulin Diabetic teaching Patient to followup with her endocrinologist. She might have to be started on insulin.  DVT Prophylaxis  Lovenox  AM Labs Ordered, also please review Full Orders  Family Communication: Admission, patients condition and plan of care including tests being ordered have been discussed with the patient who indicate understanding and agree with the plan and Code Status.  Code Status full  Disposition Plan: Home to followup with her endocrinologist Dr Talmage Nap  Time spent in minutes : 35  Condition GUARDED

## 2012-02-04 DIAGNOSIS — E049 Nontoxic goiter, unspecified: Secondary | ICD-10-CM

## 2012-02-04 DIAGNOSIS — E785 Hyperlipidemia, unspecified: Secondary | ICD-10-CM

## 2012-02-04 LAB — BASIC METABOLIC PANEL
BUN: 9 mg/dL (ref 6–23)
Chloride: 101 mEq/L (ref 96–112)
GFR calc Af Amer: >90 mL/min (ref >90)
Potassium: 4.1 mEq/L (ref 3.5–5.1)
Sodium: 135 mEq/L (ref 135–145)

## 2012-02-04 LAB — CBC
HCT: 38.6 % (ref 36.0–46.0)
Platelets: 216 10*3/uL (ref 150–400)
RDW: 14.2 % (ref 11.5–15.5)
WBC: 4.6 10*3/uL (ref 4.0–10.5)

## 2012-02-04 MED ORDER — INSULIN ASPART 100 UNIT/ML ~~LOC~~ SOLN: 0.0000 [IU] | Freq: Three times a day (TID) | SUBCUTANEOUS | Status: DC

## 2012-02-04 MED ORDER — INSULIN GLARGINE 100 UNIT/ML ~~LOC~~ SOLN: 14.0000 [IU] | Freq: Every day | SUBCUTANEOUS | Status: DC

## 2012-02-04 MED ORDER — ONETOUCH ULTRASOFT LANCETS MISC: Status: DC

## 2012-02-04 MED ORDER — GLUCOSE BLOOD VI STRP: ORAL_STRIP | Status: DC

## 2012-02-04 NOTE — Progress Notes (Signed)
Nutrition Brief Note  Patient identified on the Malnutrition Screening Tool (MST) Report  Body mass index is 31.07 kg/(m^2). Pt meets criteria for obesity grade 1 based on current BMI.   Pt reports losing 25-30# in the last 2 1/2 based on decreased appetite, decreasing portion size, helathier eating, uncontrolled DM.    Current diet order is CHO MOD MED, patient is consuming approximately 100% of meals at this time. Labs and medications reviewed.     Lab Results  Component Value Date   HGBA1C 16.3* 02/02/2012   Lab Results  Component Value Date   CREATININE 0.58 02/04/2012    Reviewed DM guidelines with pt.  Encouraged continued but slower weight loss with healthy eating, decreased portion size and exercise.   If nutrition issues arise, please consult RD.   Oran Rein, RD, LDN Clinical Inpatient Dietitian Pager:  [phone] Weekend and after hours pager:  [phone]

## 2012-02-04 NOTE — Discharge Summary (Signed)
Physician Discharge Summary  Brenda Fleming ZOX:[id number] DOB: Aug 30, 1960 DOA: 02/02/2012  PCP: Lorretta Harp, MD  Admit date: 02/02/2012 Discharge date: 02/04/2012  Recommendations for Outpatient Follow-up:   Follow-up Information    Follow up with Lorretta Harp, MD. In 1 week.   Contact information:   [address] [place] [address] [phone]       Follow up with Dorisann Frames, MD. In 1 week.   Contact information:   [address] [place] [address] [phone]         Discharge Diagnoses:  DKA/mild; Patient noncompliant  Hyroid enlargement patient is having workup as outpatient Dyslipidemia  Discharge Condition:Stable Disposition: Home  Diet recommendation:Carb Modified  Filed Weights   02/03/12 0500  Weight: 84.7 kg (186 lb 11.7 oz)    History of present illness:  Brenda Fleming is a 51 y.o. female, with past medical history significant for diabetes mellitus with one episode of DKA around 7 years ago for who presented today to the emergency room for evaluation of an elevated blood sugar. The patient reports that for the last few days she's been having leg cramps. She hasn't been checking her blood sugar very well in the last few weeks and even months also her last hemoglobin A1c was very good 6 months ago she's been having polydipsia and she's been losing weight he    Hospital Course:  DKA/mild; Patient noncompliant  Patient came in with mild DKA.  Patient was noncompliant with her home medications. Started her on insulin Lantus and sliding scale. She will follow up with Dr. Romero Belling to make adjustments her medications.   Thyroid enlargement patient is having workup as outpatient Patient sees endocrinology outpatient   Dyslipidemia Continue statin medication   Discharge Instructions  Discharge Orders    Future Orders Please Complete By Expires   Diet Carb Modified          Medication List     As of  02/04/2012  1:48 PM    STOP taking these medications         metformin 500 MG (OSM) 24 hr tablet   Commonly known as: FORTAMET      Potassium 99 MG Tabs      TAKE these medications         aspirin 325 MG EC tablet   Take 325 mg by mouth at bedtime.      atorvastatin 40 MG tablet   Commonly known as: LIPITOR   Take 40 mg by mouth daily.      calcium & magnesium carbonates 311-232 MG per tablet   Commonly known as: MYLANTA   Take 1 tablet by mouth daily.      fish oil-omega-3 fatty acids 1000 MG capsule   Take 1 g by mouth daily.      glucose blood test strip   Use as instructed      insulin aspart 100 UNIT/ML injection   Commonly known as: novoLOG   Inject 0-15 Units into the skin 3 (three) times daily with meals.      insulin glargine 100 UNIT/ML injection   Commonly known as: LANTUS   Inject 14 Units into the skin at bedtime.      lisinopril 10 MG tablet   Commonly known as: PRINIVIL,ZESTRIL   Take 10 mg by mouth daily.      Melatonin 3 MG Tabs   Take 1 tablet by mouth at bedtime.      multivitamin with minerals Tabs  Take 1 tablet by mouth daily.      onetouch ultrasoft lancets   Use as instructed           Follow-up Information    Follow up with Lorretta Harp, MD. In 1 week.   Contact information:   [address] [place] [address] [phone]       Follow up with Dorisann Frames, MD. In 1 week.   Contact information:   [address] [place] [address] [phone]           The results of significant diagnostics from this hospitalization (including imaging, microbiology, ancillary and laboratory) are listed below for reference.    Significant Diagnostic Studies: Dg Chest 2 View  02/03/2012  *RADIOLOGY REPORT*  Clinical Data: Diabetes; hyperglycemia.  Anxiety.  CHEST - 2 VIEW  Comparison: None.  Findings: The lungs are well-aerated and clear.  There is no evidence of focal opacification, pleural  effusion or pneumothorax.  The heart is borderline normal in size; the mediastinal contour is within normal limits.  No acute osseous abnormalities are seen.  IMPRESSION: No acute cardiopulmonary process seen.   Original Report Authenticated By: Tonia Ghent, M.D.     Labs: Basic Metabolic Panel:  Lab 02/04/12 0272 02/03/12 0515 02/02/12 2024  NA 135 134* 128*  K 4.1 4.5 5.2*  CL 101 103 91*  CO2 23 20 19   GLUCOSE 330* 179* 338*  BUN 9 10 13   CREATININE 0.58 0.56 0.63  CALCIUM 9.1 8.2* 9.5  MG -- 1.8 --  PHOS 3.9 2.5 --   CBC:  Lab 02/04/12 0530 02/03/12 0410 02/02/12 2024  WBC 4.6 5.8 6.9  NEUTROABS -- -- --  HGB 12.8 12.0 13.9  HCT 38.6 36.5 40.7  MCV 73.7* 73.9* 72.5*  PLT 216 236 278     Lab 02/03/12 2208 02/03/12 1630 02/03/12 1127 02/03/12 0903 02/03/12 0750  GLUCAP 306* 289* 284* 323* 107*      Time coordinating discharge:60 min  Signed:  Molli Posey MD [facility] 02/04/2012, 1:48 PM

## 2012-02-04 NOTE — Progress Notes (Addendum)
Discharge instructions reviewed with patient and husband. Educated both on proper technique for self administration  of insulin using a syringe and pen. Patient and husband both verbalized understanding and has no questions at this time.

## 2012-05-11 ENCOUNTER — Other Ambulatory Visit: Payer: Self-pay

## 2012-06-05 ENCOUNTER — Other Ambulatory Visit: Payer: Self-pay | Admitting: Endocrinology

## 2012-06-05 DIAGNOSIS — E049 Nontoxic goiter, unspecified: Secondary | ICD-10-CM

## 2012-06-17 ENCOUNTER — Ambulatory Visit
Admission: RE | Admit: 2012-06-17 | Discharge: 2012-06-17 | Disposition: A | Discharge status: Home / Self Care | Payer: BC Managed Care – PPO | Source: Ambulatory Visit | Attending: Endocrinology | Admitting: Endocrinology

## 2012-06-17 DIAGNOSIS — E049 Nontoxic goiter, unspecified: Secondary | ICD-10-CM

## 2012-06-26 ENCOUNTER — Other Ambulatory Visit: Payer: Self-pay | Admitting: Endocrinology

## 2012-06-26 DIAGNOSIS — E049 Nontoxic goiter, unspecified: Secondary | ICD-10-CM

## 2012-12-30 ENCOUNTER — Other Ambulatory Visit: Payer: BC Managed Care – PPO

## 2013-01-30 ENCOUNTER — Other Ambulatory Visit: Payer: Self-pay

## 2013-05-06 ENCOUNTER — Other Ambulatory Visit: Payer: Self-pay | Admitting: Endocrinology

## 2013-05-06 DIAGNOSIS — E049 Nontoxic goiter, unspecified: Secondary | ICD-10-CM

## 2013-08-29 ENCOUNTER — Other Ambulatory Visit: Payer: BC Managed Care – PPO

## 2013-09-03 ENCOUNTER — Other Ambulatory Visit: Payer: BC Managed Care – PPO

## 2013-10-15 ENCOUNTER — Other Ambulatory Visit: Payer: BC Managed Care – PPO

## 2014-01-09 ENCOUNTER — Other Ambulatory Visit: Payer: Self-pay

## 2014-06-05 ENCOUNTER — Other Ambulatory Visit: Payer: Self-pay | Admitting: Family Medicine

## 2014-06-05 DIAGNOSIS — E049 Nontoxic goiter, unspecified: Secondary | ICD-10-CM

## 2014-06-29 ENCOUNTER — Ambulatory Visit
Admission: RE | Admit: 2014-06-29 | Discharge: 2014-06-29 | Disposition: A | Discharge status: Home / Self Care | Payer: BC Managed Care – PPO | Source: Ambulatory Visit | Attending: Family Medicine | Admitting: Family Medicine

## 2014-06-29 DIAGNOSIS — E049 Nontoxic goiter, unspecified: Secondary | ICD-10-CM

## 2014-09-21 ENCOUNTER — Other Ambulatory Visit: Payer: Self-pay

## 2015-02-04 ENCOUNTER — Encounter: Payer: Self-pay | Admitting: Family Medicine

## 2015-04-13 ENCOUNTER — Encounter: Payer: Self-pay | Admitting: Gastroenterology

## 2015-05-13 ENCOUNTER — Ambulatory Visit (AMBULATORY_SURGERY_CENTER): Payer: Self-pay | Admitting: *Deleted

## 2015-05-13 VITALS — Ht 65.0 in | Wt 198.0 lb

## 2015-05-13 DIAGNOSIS — Z1211 Encounter for screening for malignant neoplasm of colon: Secondary | ICD-10-CM

## 2015-05-13 MED ORDER — NA SULFATE-K SULFATE-MG SULF 17.5-3.13-1.6 GM/177ML PO SOLN: 1.0000 | Freq: Once | ORAL | Status: DC

## 2015-05-13 NOTE — Progress Notes (Signed)
No egg or soy allergy. No anesthesia problems.  No home O2,  No diet meds.  

## 2015-05-27 ENCOUNTER — Encounter: Payer: BC Managed Care – PPO | Admitting: Gastroenterology

## 2015-06-03 ENCOUNTER — Encounter: Payer: Self-pay | Admitting: Gastroenterology

## 2015-06-03 ENCOUNTER — Ambulatory Visit (AMBULATORY_SURGERY_CENTER): Payer: BC Managed Care – PPO | Admitting: Gastroenterology

## 2015-06-03 VITALS — BP 145/92 | HR 71 | Temp 96.6°F | Resp 11 | Ht 65.0 in | Wt 198.0 lb

## 2015-06-03 DIAGNOSIS — Z1211 Encounter for screening for malignant neoplasm of colon: Secondary | ICD-10-CM | POA: Diagnosis present

## 2015-06-03 LAB — GLUCOSE, CAPILLARY
GLUCOSE-CAPILLARY: 130 mg/dL — AB (ref 65–99)
Glucose-Capillary: 157 mg/dL — ABNORMAL HIGH (ref 65–99)

## 2015-06-03 MED ORDER — SODIUM CHLORIDE 0.9 % IV SOLN: 500.0000 mL | INTRAVENOUS | Status: DC

## 2015-06-03 NOTE — Op Note (Signed)
[facility] Patient Name: Brenda Fleming Procedure Date: 06/03/2015 9:17 AM MRN: [id number] Endoscopist: Mallie Mussel L. Loletha Carrow , MD Age: 55 Referring MD:  Date of Birth: [birth year] Gender: Female Procedure:                Colonoscopy Indications:              Screening for colorectal malignant neoplasm Medicines:                Monitored Anesthesia Care Procedure:                Pre-Anesthesia Assessment:                           - Prior to the procedure, a History and Physical                            was performed, and patient medications and                            allergies were reviewed. The patient's tolerance of                            previous anesthesia was also reviewed. The risks                            and benefits of the procedure and the sedation                            options and risks were discussed with the patient.                            All questions were answered, and informed consent                            was obtained. Prior Anticoagulants: The patient has                            taken no previous anticoagulant or antiplatelet                            agents. ASA Grade Assessment: III - A patient with                            severe systemic disease. After reviewing the risks                            and benefits, the patient was deemed in                            satisfactory condition to undergo the procedure.                           After obtaining informed consent, the colonoscope  was passed under direct vision. Throughout the                            procedure, the patient's blood pressure, pulse, and                            oxygen saturations were monitored continuously. The                            Model CF-HQ190L [id number]) scope was introduced                            through the anus and advanced to the the cecum,                            identified by appendiceal orifice and  ileocecal                            valve. The colonoscopy was somewhat difficult due                            to significant looping. The patient tolerated the                            procedure well. The quality of the bowel                            preparation was good. The quality of the bowel                            preparation was evaluated using the BBPS [facility]                            Bowel Preparation Scale) with scores of: Right                            Colon = 2 (minor amount of residual staining, small                            fragments of stool and/or opaque liquid, but mucosa                            seen well), Transverse Colon = 2 (minor amount of                            residual staining, small fragments of stool and/or                            opaque liquid, but mucosa seen well) and Left Colon                            = 2 (minor amount of residual staining, small  fragments of stool and/or opaque liquid, but mucosa                            seen well). The total BBPS score equals 6, after                            lavage. The ileocecal valve, appendiceal orifice,                            and rectum were photographed. The quality of the                            bowel preparation was good. Scope In: 9:35:35 AM Scope Out: 9:53:35 AM Scope Withdrawal Time: 0 hours 11 minutes 11 seconds  Total Procedure Duration: 0 hours 18 minutes 0 seconds  Findings:      The perianal and digital rectal examinations were normal.      The entire examined colon appeared normal on direct and retroflexion       views. Complications:            No immediate complications. Estimated Blood Loss:     Estimated blood loss: none. Impression:               - The entire examined colon is normal on direct and                            retroflexion views.                           - No specimens collected. Recommendation:           - Patient  has a contact number available for                            emergencies. The signs and symptoms of potential                            delayed complications were discussed with the                            patient. Return to normal activities tomorrow.                            Written discharge instructions were provided to the                            patient.                           - Resume previous diet.                           - Continue present medications.                           - Repeat colonoscopy in 10 years for screening  purposes. Procedure Code(s):        --- Professional ---                           [phone], Colonoscopy, flexible; diagnostic, including                            collection of specimen(s) by brushing or washing,                            when performed (separate procedure) CPT copyright 2016 American Medical Association. All rights reserved. Ikhlas Albo L. Loletha Carrow, MD Estill Cotta Danis, MD 06/03/2015 10:06:49 AM Number of Addenda: 0

## 2015-06-03 NOTE — Patient Instructions (Signed)
YOU HAD AN ENDOSCOPIC PROCEDURE TODAY AT [facility]:   Refer to the procedure report that was given to you for any specific questions about what was found during the examination.  If the procedure report does not answer your questions, please call your gastroenterologist to clarify.  If you requested that your care partner not be given the details of your procedure findings, then the procedure report has been included in a sealed envelope for you to review at your convenience later.  YOU SHOULD EXPECT: Some feelings of bloating in the abdomen. Passage of more gas than usual.  Walking can help get rid of the air that was put into your GI tract during the procedure and reduce the bloating. If you had a lower endoscopy (such as a colonoscopy or flexible sigmoidoscopy) you may notice spotting of blood in your stool or on the toilet paper. If you underwent a bowel prep for your procedure, you may not have a normal bowel movement for a few days.  Please Note:  You might notice some irritation and congestion in your nose or some drainage.  This is from the oxygen used during your procedure.  There is no need for concern and it should clear up in a day or so.  SYMPTOMS TO REPORT IMMEDIATELY:   Following lower endoscopy (colonoscopy or flexible sigmoidoscopy):  Excessive amounts of blood in the stool  Significant tenderness or worsening of abdominal pains  Swelling of the abdomen that is new, acute  Fever of 100F or higher   For urgent or emergent issues, a gastroenterologist can be reached at any hour by calling [phone].   DIET: Your first meal following the procedure should be a small meal and then it is ok to progress to your normal diet. Heavy or fried foods are harder to digest and may make you feel nauseous or bloated.  Likewise, meals heavy in dairy and vegetables can increase bloating.  Drink plenty of fluids but you should avoid alcoholic beverages for 24  hours.  ACTIVITY:  You should plan to take it easy for the rest of today and you should NOT DRIVE or use heavy machinery until tomorrow (because of the sedation medicines used during the test).    FOLLOW UP: Our staff will call the number listed on your records the next business day following your procedure to check on you and address any questions or concerns that you may have regarding the information given to you following your procedure. If we do not reach you, we will leave a message.  However, if you are feeling well and you are not experiencing any problems, there is no need to return our call.  We will assume that you have returned to your regular daily activities without incident.  If any biopsies were taken you will be contacted by phone or by letter within the next 1-3 weeks.  Please call us at [phone] if you have not heard about the biopsies in 3 weeks.    SIGNATURES/CONFIDENTIALITY: You and/or your care partner have signed paperwork which will be entered into your electronic medical record.  These signatures attest to the fact that that the information above on your After Visit Summary has been reviewed and is understood.  Full responsibility of the confidentiality of this discharge information lies with you and/or your care-partner.    Your blood sugar was 130 in the recovery room. You may resume your current medications today. Please call if any questions or  concerns.

## 2015-06-03 NOTE — Progress Notes (Signed)
Patient awakening,vss,report to rn 

## 2015-06-03 NOTE — Progress Notes (Signed)
No problems noted in the recovery room. maw 

## 2015-06-04 ENCOUNTER — Telehealth: Payer: Self-pay

## 2015-06-04 NOTE — Telephone Encounter (Signed)
  Follow up Call-  Call back number 06/03/2015  Post procedure Call Back phone  # [phone]  Permission to leave phone message No    Patient was called for follow up after her procedure on 06/03/2015. No answer at the number given for follow up phone call. Mail box was full so I was not able to leave a message.

## 2018-07-25 ENCOUNTER — Encounter: Payer: Self-pay | Admitting: Family Medicine

## 2018-07-29 ENCOUNTER — Other Ambulatory Visit: Payer: Self-pay

## 2018-07-29 ENCOUNTER — Ambulatory Visit (INDEPENDENT_AMBULATORY_CARE_PROVIDER_SITE_OTHER): Payer: BC Managed Care – PPO | Admitting: Family Medicine

## 2018-07-29 ENCOUNTER — Encounter: Payer: Self-pay | Admitting: Family Medicine

## 2018-07-29 DIAGNOSIS — I10 Essential (primary) hypertension: Secondary | ICD-10-CM

## 2018-07-29 DIAGNOSIS — E049 Nontoxic goiter, unspecified: Secondary | ICD-10-CM

## 2018-07-29 DIAGNOSIS — E785 Hyperlipidemia, unspecified: Secondary | ICD-10-CM

## 2018-07-29 DIAGNOSIS — E119 Type 2 diabetes mellitus without complications: Secondary | ICD-10-CM

## 2018-07-29 MED ORDER — ATORVASTATIN CALCIUM 40 MG PO TABS: 40.0000 mg | ORAL_TABLET | Freq: Every day | ORAL | 1 refills | Status: DC

## 2018-07-29 MED ORDER — AMLODIPINE BESYLATE 5 MG PO TABS: 5.0000 mg | ORAL_TABLET | Freq: Every day | ORAL | 1 refills | Status: DC

## 2018-07-29 NOTE — Progress Notes (Deleted)
Called patient to initiate their telephone visit with provider Molli Barrows, FNP-C. Verified date of birth. Patient states that her FSBS have been averaging around 130. Reading this morning was 141. Doesn't check BP at home. Needs refills on her Amlodipine & Lipitor. KWalker, CMA.

## 2018-07-29 NOTE — Progress Notes (Signed)
Virtual Visit via Telephone Note  I connected with Brenda Fleming on 07/29/18 at  8:50 AM EDT by telephone and verified that I am speaking with the correct person using two identifiers.  Location: Patient: Is located at primary care residence. Provider:Located within the primary care office    I discussed the limitations, risks, security and privacy concerns of performing an evaluation and management service by telephone and the availability of in person appointments. I also discussed with the patient that there may be a patient responsible charge related to this service. The patient expressed understanding and agreed to proceed.   History of Present Illness: Brenda Fleming is establishing care today. Previously followed at [facility].   Medical history is significant for type 2 diabetes, thyroid enlargement, hyperlipidemia, and hypertension.  Diabetes, Type 2 Diabetes diagnosed in 2010. She has been controlled with oral antidiabetics, although, has been hospitalized twice for DKA. Currently prescribed metformin and glipizide. Last A1C 7.0. Recent readings have remained less than 120. Current readings 141. Denies neuropathic pain, urinary frequency, increased thirst,or increased appetite. She is inactive of exercise. She is planning to start some form of exercise soon. Tries to make good choices regarding food selection. Wishes to some day stop medication if she can gain better glycemic control naturally. Shyleigh has hypertension. She does not monitor her blood pressure, although has access to a BP cuff. No history of any other known cardiovascular disease. Reports efforts to adhere to low sodium diet. She is a nonsmoker. Denies any episodes of dizziness, headaches, shortness of breath, or chest pain.  Health promoting activities: Using meditation and deep breathing to reduce stress. Drinks a home detox with green tea and vinegar daily at bedtime. Plans to begin some form exercise soon.  Family  History  Problem Relation Age of Onset  . Hypertension Mother   . Anemia Mother   . Heart disease Father   . Diabetes Brother   . Heart disease Brother   . Diabetes Brother   . Diabetes Brother   . Colon cancer Neg Hx   . Stroke Neg Hx   . Cancer Neg Hx     Assessment and Plan: 1. Type 2 diabetes mellitus without complication, without long-term current use of insulin ([place]).  Last A1c was 7.0 almost 1 year ago.  Patient will return to office in 1 to 2 weeks for lab visit and will have A1c evaluated at that time.  For now she is advised to continue metformin 500 mg twice daily, she was previously taking only 1 metformin a day however it has been prescribed by prior provider 500 mg twice daily.  Patient was given clarity regarding the dose and frequency of metformin.  She will also continue glipizide.  2. Hyperlipidemia, unspecified hyperlipidemia type -Checking a fasting lipid panel at follow-up.  She will continue statin therapy as prescribed for now.  3. Essential hypertension -Uncertain of control. -Patient will return to office in 1 to 2 weeks for blood pressure check during the lab visit. -Encouraged to continue medication, reduce sodium, implement routine physical exercise.  4. Enlarged thyroid -Check thyroid panel during lab visit.  Follow Up Instructions: Lab visit 1-2 weeks (fasting) 8 weeks face to face follow-up    I discussed the assessment and treatment plan with the patient. The patient was provided an opportunity to ask questions and all were answered. The patient agreed with the plan and demonstrated an understanding of the instructions.   The patient was advised to call back  or seek an in-person evaluation if the symptoms worsen or if the condition fails to improve as anticipated.  I provided 25 minutes of non-face-to-face time during this encounter.   Molli Barrows, FNP Primary Care at [facility] [address], [place]  [place] [phone]: [phone]

## 2018-08-05 ENCOUNTER — Ambulatory Visit (INDEPENDENT_AMBULATORY_CARE_PROVIDER_SITE_OTHER): Payer: BC Managed Care – PPO

## 2018-08-05 ENCOUNTER — Other Ambulatory Visit: Payer: Self-pay

## 2018-08-05 VITALS — BP 132/86 | HR 99 | Temp 98.2°F | Resp 17 | Wt 195.6 lb

## 2018-08-05 DIAGNOSIS — E119 Type 2 diabetes mellitus without complications: Secondary | ICD-10-CM

## 2018-08-05 DIAGNOSIS — E049 Nontoxic goiter, unspecified: Secondary | ICD-10-CM

## 2018-08-05 DIAGNOSIS — I10 Essential (primary) hypertension: Secondary | ICD-10-CM

## 2018-08-05 DIAGNOSIS — E785 Hyperlipidemia, unspecified: Secondary | ICD-10-CM

## 2018-08-05 MED ORDER — LISINOPRIL 40 MG PO TABS: 40.0000 mg | ORAL_TABLET | Freq: Every day | ORAL | 1 refills | Status: DC

## 2018-08-05 NOTE — Addendum Note (Signed)
Addended by: Carylon Perches on: 08/05/2018 08:51 AM   Modules accepted: Orders

## 2018-08-05 NOTE — Progress Notes (Addendum)
Patient here for BP check. Patient is fasting so she hasn't taken any medication today. Vitals listed below. Spoke with provider & she states that follow up will be based on lab results. Patient did request a refill on her Lisinopril while she was here. Prescription sent to her pharmacy. KWalker, CMA.  Vitals:   08/05/18 0845  BP: 132/86  Pulse: 99  Resp: 17  Temp: 98.2 F (36.8 C)  SpO2: 97%

## 2018-08-05 NOTE — Progress Notes (Signed)
Labs only and BP check visit.

## 2018-08-06 LAB — COMPREHENSIVE METABOLIC PANEL
ALT: 18 IU/L (ref 0–32)
AST: 12 IU/L (ref 0–40)
Albumin/Globulin Ratio: 1.4 (ref 1.2–2.2)
Albumin: 4.3 g/dL (ref 3.8–4.9)
Alkaline Phosphatase: 106 IU/L (ref 39–117)
BUN/Creatinine Ratio: 13 (ref 12–28)
BUN: 9 mg/dL (ref 8–27)
Bilirubin Total: 0.2 mg/dL (ref 0.0–1.2)
CO2: 20 mmol/L (ref 20–29)
Calcium: 9.4 mg/dL (ref 8.7–10.3)
Chloride: 102 mmol/L (ref 96–106)
Creatinine, Ser: 0.68 mg/dL (ref 0.57–1.00)
GFR calc Af Amer: 110 mL/min/{1.73_m2} (ref >59)
GFR calc non Af Amer: 95 mL/min/{1.73_m2} (ref >59)
Globulin, Total: 3 g/dL (ref 1.5–4.5)
Glucose: 149 mg/dL — ABNORMAL HIGH (ref 65–99)
Potassium: 4.4 mmol/L (ref 3.5–5.2)
Sodium: 138 mmol/L (ref 134–144)
Total Protein: 7.3 g/dL (ref 6.0–8.5)

## 2018-08-06 LAB — LIPID PANEL
Chol/HDL Ratio: 4.2 ratio (ref 0.0–4.4)
Cholesterol, Total: 173 mg/dL (ref 100–199)
HDL: 41 mg/dL (ref >39)
LDL Calculated: 110 mg/dL — ABNORMAL HIGH (ref 0–99)
Triglycerides: 109 mg/dL (ref 0–149)
VLDL Cholesterol Cal: 22 mg/dL (ref 5–40)

## 2018-08-06 LAB — CBC WITH DIFFERENTIAL/PLATELET
Basophils Absolute: 0 10*3/uL (ref 0.0–0.2)
Basos: 1 %
EOS (ABSOLUTE): 0.1 10*3/uL (ref 0.0–0.4)
Eos: 1 %
Hematocrit: 38.9 % (ref 34.0–46.6)
Hemoglobin: 13.1 g/dL (ref 11.1–15.9)
Immature Grans (Abs): 0 10*3/uL (ref 0.0–0.1)
Immature Granulocytes: 0 %
Lymphocytes Absolute: 2.3 10*3/uL (ref 0.7–3.1)
Lymphs: 36 %
MCH: 24.8 pg — ABNORMAL LOW (ref 26.6–33.0)
MCHC: 33.7 g/dL (ref 31.5–35.7)
MCV: 74 fL — ABNORMAL LOW (ref 79–97)
Monocytes Absolute: 0.5 10*3/uL (ref 0.1–0.9)
Monocytes: 7 %
Neutrophils Absolute: 3.5 10*3/uL (ref 1.4–7.0)
Neutrophils: 55 %
Platelets: 346 10*3/uL (ref 150–450)
RBC: 5.28 x10E6/uL (ref 3.77–5.28)
RDW: 14.1 % (ref 11.7–15.4)
WBC: 6.4 10*3/uL (ref 3.4–10.8)

## 2018-08-06 LAB — HEMOGLOBIN A1C
Est. average glucose Bld gHb Est-mCnc: 157 mg/dL
Hgb A1c MFr Bld: 7.1 % — ABNORMAL HIGH (ref 4.8–5.6)

## 2018-08-06 LAB — TSH: TSH: 0.436 u[IU]/mL — ABNORMAL LOW (ref 0.450–4.500)

## 2018-08-07 ENCOUNTER — Telehealth: Payer: Self-pay | Admitting: Family Medicine

## 2018-08-07 DIAGNOSIS — E042 Nontoxic multinodular goiter: Secondary | ICD-10-CM

## 2018-08-07 DIAGNOSIS — R7989 Other specified abnormal findings of blood chemistry: Secondary | ICD-10-CM

## 2018-08-07 NOTE — Telephone Encounter (Signed)
Patient notified of lab results & recommendations. Expressed understanding. She states that she was previously on Levothyroxine for her thyroid but was taken off of it about 1 year ago. She scheduled a lab appointment to repeat thyroid labs on 09/04/18 at 8:30 AM.  Aware that I will be calling her back with appointment details for the thyroid ultrasound.

## 2018-08-07 NOTE — Telephone Encounter (Signed)
Brenda Fleming,  Please contact patient to advise her recent labs were significant for the following:  Thyroid study was abnormally low, which could indicate the possibility of thyroid dysfunction. I would like to repeat a thyroid panel in 4 weeks and given patient has a history of  Thyroid enlargement, I would like to have her scheduled for ultrasound of thyroid . She had a scan back in 2016 which showed stable non-concerning thyroid nodules, however given the length of time since the last study, I would like to repeat.  A1C is stable at 7.1. Goal <7.0. Cholesterol is stable, although bad cholesterol LDL is higher than recommended for diabetic at 110. I recommend lifestyle changes such as engaging in routine physical activity with a goal of 150 minutes per week, increasing intake of vegetables, fruits, fiber, and selecting lean cuts of meat. No medication changes warranted.  Follow-up will remain in July.

## 2018-08-08 NOTE — Telephone Encounter (Signed)
Spoke to patient

## 2018-08-08 NOTE — Telephone Encounter (Signed)
Called central [phone]).  Patient is scheduled for thyroid ultrasound on 08/14/2018 @ 2:15 pm. Patient will enter through the main entrance of [place].  Can you notify patient of appt? thanks

## 2018-08-08 NOTE — Telephone Encounter (Signed)
Called [facility] to see if prior authorization was needed for thyroid US. No PA is required.

## 2018-08-14 ENCOUNTER — Ambulatory Visit (HOSPITAL_COMMUNITY): Payer: BC Managed Care – PPO

## 2018-08-20 ENCOUNTER — Ambulatory Visit (HOSPITAL_COMMUNITY): Admission: RE | Admit: 2018-08-20 | Payer: BC Managed Care – PPO | Source: Ambulatory Visit

## 2018-09-04 ENCOUNTER — Other Ambulatory Visit: Payer: BC Managed Care – PPO

## 2018-09-30 ENCOUNTER — Telehealth: Payer: Self-pay | Admitting: Family Medicine

## 2018-09-30 DIAGNOSIS — R7989 Other specified abnormal findings of blood chemistry: Secondary | ICD-10-CM

## 2018-09-30 NOTE — Telephone Encounter (Signed)
Brenda Fleming,  Patient was scheduled for a ultrasound of the thyroid back in May.  This study was scheduled however I do not see that the patient ever had the study completed.  She was scheduled for follow-up lab appointment on 10/02/2018.  Patient inquire if we can get the study rescheduled for her.  And she will still need to come in for that lab appointment on 10/02/2018

## 2018-10-02 ENCOUNTER — Other Ambulatory Visit: Payer: BC Managed Care – PPO

## 2018-10-04 NOTE — Telephone Encounter (Signed)
Patient cancelled both ultrasound appointments & cancelled the lab appointment for 10/02/2018.

## 2018-10-10 ENCOUNTER — Other Ambulatory Visit: Payer: Self-pay

## 2018-10-10 ENCOUNTER — Other Ambulatory Visit: Payer: BC Managed Care – PPO

## 2018-10-10 DIAGNOSIS — R7989 Other specified abnormal findings of blood chemistry: Secondary | ICD-10-CM

## 2018-10-10 MED ORDER — METFORMIN HCL ER 500 MG PO TB24: 1000.0000 mg | ORAL_TABLET | Freq: Two times a day (BID) | ORAL | 0 refills | Status: DC

## 2018-10-10 NOTE — Progress Notes (Signed)
Patient here for repeat thyroid labs. Patient requested to go to [facility] draw station. Requisition printed & given to patient. KWalker, CMA.

## 2018-10-18 ENCOUNTER — Other Ambulatory Visit: Payer: Self-pay | Admitting: Family Medicine

## 2018-10-19 LAB — THYROID PANEL WITH TSH
Free Thyroxine Index: 2.4 (ref 1.2–4.9)
T3 Uptake Ratio: 28 % (ref 24–39)
T4, Total: 8.7 ug/dL (ref 4.5–12.0)
TSH: 0.768 u[IU]/mL (ref 0.450–4.500)

## 2019-01-30 ENCOUNTER — Telehealth: Payer: Self-pay | Admitting: Family Medicine

## 2019-01-30 MED ORDER — LISINOPRIL 40 MG PO TABS: 40.0000 mg | ORAL_TABLET | Freq: Every day | ORAL | 0 refills | Status: DC

## 2019-01-30 MED ORDER — METFORMIN HCL ER 500 MG PO TB24: 1000.0000 mg | ORAL_TABLET | Freq: Two times a day (BID) | ORAL | 0 refills | Status: DC

## 2019-01-30 MED ORDER — GLIPIZIDE 10 MG PO TABS: 10.0000 mg | ORAL_TABLET | Freq: Two times a day (BID) | ORAL | 0 refills | Status: DC

## 2019-01-30 MED ORDER — ATORVASTATIN CALCIUM 40 MG PO TABS: 40.0000 mg | ORAL_TABLET | Freq: Every day | ORAL | 0 refills | Status: DC

## 2019-01-30 MED ORDER — AMLODIPINE BESYLATE 5 MG PO TABS: 5.0000 mg | ORAL_TABLET | Freq: Every day | ORAL | 0 refills | Status: DC

## 2019-01-30 NOTE — Telephone Encounter (Signed)
30 day supply sent on refills.

## 2019-01-30 NOTE — Telephone Encounter (Signed)
1) Medication(s) Requested (by name): -amLODipine (NORVASC) 5 MG tablet  -atorvastatin (LIPITOR) 40 MG tablet  -glipiZIDE (GLUCOTROL) 10 MG tablet  -lisinopril (ZESTRIL) 40 MG tablet  -metFORMIN (GLUCOPHAGE-XR) 500 MG 24 hr tablet   2) Pharmacy of Choice: -[id number] - [place], [place]

## 2019-02-05 ENCOUNTER — Telehealth: Payer: Self-pay

## 2019-02-05 NOTE — Telephone Encounter (Signed)
Called patient to do their pre-visit COVID screening.  Call went to voicemail, which was full. Unable to do prescreening.  

## 2019-02-06 ENCOUNTER — Ambulatory Visit: Payer: BC Managed Care – PPO

## 2019-02-17 ENCOUNTER — Ambulatory Visit: Payer: BC Managed Care – PPO

## 2019-02-19 ENCOUNTER — Telehealth: Payer: Self-pay

## 2019-02-19 NOTE — Telephone Encounter (Signed)

## 2019-02-24 ENCOUNTER — Ambulatory Visit (INDEPENDENT_AMBULATORY_CARE_PROVIDER_SITE_OTHER): Payer: BC Managed Care – PPO | Admitting: Family Medicine

## 2019-02-24 VITALS — BP 117/79 | HR 93 | Temp 97.7°F | Resp 17 | Ht 64.0 in | Wt 189.0 lb

## 2019-02-24 DIAGNOSIS — E119 Type 2 diabetes mellitus without complications: Secondary | ICD-10-CM | POA: Diagnosis not present

## 2019-02-24 DIAGNOSIS — E785 Hyperlipidemia, unspecified: Secondary | ICD-10-CM

## 2019-02-24 DIAGNOSIS — Z79899 Other long term (current) drug therapy: Secondary | ICD-10-CM

## 2019-02-24 DIAGNOSIS — I10 Essential (primary) hypertension: Secondary | ICD-10-CM

## 2019-02-24 DIAGNOSIS — Z91199 Patient's noncompliance with other medical treatment and regimen due to unspecified reason: Secondary | ICD-10-CM

## 2019-02-24 DIAGNOSIS — E042 Nontoxic multinodular goiter: Secondary | ICD-10-CM | POA: Diagnosis not present

## 2019-02-24 DIAGNOSIS — R7989 Other specified abnormal findings of blood chemistry: Secondary | ICD-10-CM | POA: Diagnosis not present

## 2019-02-24 DIAGNOSIS — Z23 Encounter for immunization: Secondary | ICD-10-CM | POA: Diagnosis not present

## 2019-02-24 DIAGNOSIS — Z9119 Patient's noncompliance with other medical treatment and regimen: Secondary | ICD-10-CM

## 2019-02-24 LAB — GLUCOSE, POCT (MANUAL RESULT ENTRY): POC Glucose: 139 mg/dL — AB (ref 70–99)

## 2019-02-24 MED ORDER — AMLODIPINE BESYLATE 5 MG PO TABS: 5.0000 mg | ORAL_TABLET | Freq: Every day | ORAL | 1 refills | Status: DC

## 2019-02-24 MED ORDER — GLIPIZIDE 10 MG PO TABS: 10.0000 mg | ORAL_TABLET | Freq: Two times a day (BID) | ORAL | 1 refills | Status: DC

## 2019-02-24 MED ORDER — ATORVASTATIN CALCIUM 40 MG PO TABS: 40.0000 mg | ORAL_TABLET | Freq: Every day | ORAL | 1 refills | Status: DC

## 2019-02-24 MED ORDER — LISINOPRIL 40 MG PO TABS: 40.0000 mg | ORAL_TABLET | Freq: Every day | ORAL | 1 refills | Status: DC

## 2019-02-24 MED ORDER — METFORMIN HCL ER 500 MG PO TB24: 1000.0000 mg | ORAL_TABLET | Freq: Two times a day (BID) | ORAL | 1 refills | Status: DC

## 2019-02-24 NOTE — Patient Instructions (Signed)
Goiter  A goiter is an enlarged thyroid gland. The thyroid is located in the lower front of the neck. It makes hormones that affect many body parts and systems, including the system that affects how quickly the body burns fuel for energy (metabolism). Most goiters are painless and are not a cause for concern. Some goiters can affect the way your thyroid makes thyroid hormones. Goiters and conditions that cause goiters can be treated, if necessary. What are the causes? Common causes of this condition include:  Lack (deficiency) of a mineral called iodine. The thyroid gland uses iodine to make thyroid hormones.  Diseases that attack healthy cells in the body (autoimmune diseases) and affect thyroid function, such as Graves' disease or Hashimoto's disease. These diseases may cause the body to produce too much thyroid hormone (hyperthyroidism) or too little of the hormone (hypothyroidism).  Conditions that cause inflammation of the thyroid (thyroiditis).  One or more small growths on the thyroid (nodular goiter). Other causes include:  Medical problems caused by abnormal genes that are passed from parent to child (genetic defects).  Thyroid injury or infection.  Tumors that may or may not be cancerous.  Pregnancy.  Certain medicines.  Exposure to radiation. In some cases, the cause may not be known. What increases the risk? This condition is more likely to develop in:  People who do not get enough iodine in their diet.  People who have a family history of goiter.  Women.  People who are older than age 40.  People who smoke tobacco.  People who have had exposure to radiation. What are the signs or symptoms? The main symptom of this condition is swelling in the lower, front part of the neck. This swelling can range from a very small bump to a large lump. Other symptoms may include:  A tight feeling in the throat.  A hoarse voice.  Coughing.  Wheezing.  Difficulty  swallowing or breathing.  Bulging veins in the neck.  Dizziness. When a goiter is the result of an overactive thyroid (hyperthyroidism), symptoms may also include:  Nervousness or restlessness.  Inability to tolerate heat.  Unexplained weight loss.  Diarrhea.  Change in the texture of hair or skin.  Changes in heartbeat, such as skipped beats, extra beats, or a rapid heart rate.  Loss of menstruation.  Shaky hands.  Increased appetite.  Sleep problems. When a goiter is the result of an underactive thyroid (hypothyroidism), symptoms may also include:  Feeling like you have no energy (lethargy).  Inability to tolerate cold.  Weight gain that is not explained by a change in diet or exercise habits.  Dry skin.  Coarse hair.  Irregular menstrual periods.  Constipation.  Sadness or depression.  Fatigue. In some cases, there may not be any symptoms and the thyroid hormone levels may be normal. How is this diagnosed? This condition may be diagnosed based on your symptoms, your medical history, and a physical exam. You may have tests, such as:  Blood tests to check thyroid function.  Imaging tests, such as: ? Ultrasound. ? CT scan. ? MRI. ? Thyroid scan.  Removal of a tissue sample (biopsy) of the goiter or any nodules. The sample will be tested to check for cancer. How is this treated? Treatment for this condition depends on the cause and your symptoms. Treatment may include:  Medicines to regulate thyroid hormone levels.  Anti-inflammatory medicines or steroid medicines, if the goiter is caused by inflammation.  Iodine supplements or changes to your   diet, if the goiter is caused by iodine deficiency.  Radioactive iodine treatment.  Surgery to remove your thyroid. In some cases, you may only need regular check-ups with your health care provider to monitor your condition, and you may not need treatment. Follow these instructions at home:  Follow  instructions from your health care provider about any changes to your diet.  Take over-the-counter and prescription medicines only as told by your health care provider. These include supplements.  Do not use any products that contain nicotine or tobacco, such as cigarettes and e-cigarettes. If you need help quitting, ask your health care provider.  Keep all follow-up visits as told by your health care provider. This is important. Contact a health care provider if:  Your symptoms do not get better with treatment.  You have nausea, vomiting, or diarrhea. Get help right away if:  You have sudden, unexplained confusion or other mental changes.  You have a fever.  You have chest pain.  You have trouble breathing or swallowing.  You suddenly become very weak.  You experience extreme restlessness.  You feel your heart racing. Summary  A goiter is an enlarged thyroid gland.  The thyroid gland is located in the lower front of the neck. It makes hormones that affect many body parts and systems, including the system that affects how quickly the body burns fuel for energy (metabolism).  The main symptom of this condition is swelling in the lower, front part of the neck. This swelling can range from a very small bump to a large lump.  Treatment for this condition depends on the cause and your symptoms. You may need medicines, supplements, or regular monitoring of your condition. This information is not intended to replace advice given to you by your health care provider. Make sure you discuss any questions you have with your health care provider. Document Released: 08/31/2009 Document Revised: 02/23/2017 Document Reviewed: 12/07/2016 Elsevier Patient Education  2020 [employer].  Thyroid Nodule  A thyroid nodule is an isolated growth of thyroid cells that forms a lump in your thyroid gland. The thyroid gland is a butterfly-shaped gland. It is found in the lower front of your neck. This  gland sends chemical messengers (hormones) through your blood to all parts of your body. These hormones are important in regulating your body temperature and helping your body to use energy. Thyroid nodules are common. Most are not cancerous (benign). You may have one nodule or several nodules. Different types of thyroid nodules include nodules that:  Grow and fill with fluid (thyroid cysts).  Produce too much thyroid hormone (hot nodules or hyperthyroid).  Produce no thyroid hormone (cold nodules or hypothyroid).  Form from cancer cells (thyroid cancers). What are the causes? In most cases, the cause of this condition is not known. What increases the risk? The following factors may make you more likely to develop this condition.  Age. Thyroid nodules become more common in people who are older than 60 years of age.  Gender. ? Benign thyroid nodules are more common in women. ? Cancerous (malignant) thyroid nodules are more common in men.  A family history that includes: ? Thyroid nodules. ? Pheochromocytoma. ? Thyroid carcinoma. ? Hyperparathyroidism.  Certain kinds of thyroid diseases, such as Hashimoto's thyroiditis.  Lack of iodine in your diet.  A history of head and neck radiation, such as from previous cancer treatment. What are the signs or symptoms? In many cases, there are no symptoms. If you have symptoms, they  may include:  A lump in your lower neck.  Feeling a lump or tickle in your throat.  Pain in your neck, jaw, or ear.  Having trouble swallowing. Hot nodules may cause symptoms that include:  Weight loss.  Warm, flushed skin.  Feeling hot.  Feeling nervous.  A racing heartbeat. Cold nodules may cause symptoms that include:  Weight gain.  Dry skin.  Brittle hair. This may also occur with hair loss.  Feeling cold.  Fatigue. Thyroid cancer nodules may cause symptoms that include:  Hard nodules that feel stuck to the thyroid  gland.  Hoarseness.  Lumps in the glands near your thyroid (lymph nodes). How is this diagnosed? A thyroid nodule may be felt by your health care provider during a physical exam. This condition may also be diagnosed based on your symptoms. You may also have tests, including:  An ultrasound. This may be done to confirm the diagnosis.  A biopsy. This involves taking a sample from the nodule and looking at it under a microscope.  Blood tests to make sure that your thyroid is working properly.  A thyroid scan. This test uses a radioactive tracer injected into a vein to create an image of the thyroid gland on a computer screen.  Imaging tests such as MRI or CT scan. These may be done if: ? Your nodule is large. ? Your nodule is blocking your airway. ? Cancer is suspected. How is this treated? Treatment depends on the cause and size of your nodule or nodules. If the nodule is benign, treatment may not be necessary. Your health care provider may monitor the nodule to see if it goes away without treatment. If the nodule continues to grow, is cancerous, or does not go away, treatment may be needed. Treatment may include:  Having a cystic nodule drained with a needle.  Ablation therapy. In this treatment, alcohol is injected into the area of the nodule to destroy the cells. Ablation with heat (thermal ablation) may also be used.  Radioactive iodine. In this treatment, radioactive iodine is given as a pill or liquid that you drink. This substance causes the thyroid nodule to shrink.  Surgery to remove the nodule. Part or all of your thyroid gland may need to be removed as well.  Medicines. Follow these instructions at home:  Pay attention to any changes in your nodule.  Take over-the-counter and prescription medicines only as told by your health care provider.  Keep all follow-up visits as told by your health care provider. This is important. Contact a health care provider if:  Your  voice changes.  You have trouble swallowing.  You have pain in your neck, ear, or jaw that is getting worse.  Your nodule gets bigger.  Your nodule starts to make it harder for you to breathe.  Your muscles look like they are shrinking (muscle wasting). Get help right away if:  You have chest pain.  There is a loss of consciousness.  You have a sudden fever.  You feel confused.  You are seeing or hearing things that other people do not see or hear (having hallucinations).  You feel very weak.  You have mood swings.  You feel very restless.  You feel suddenly nauseous or throw up.  You suddenly have diarrhea. Summary  A thyroid nodule is an isolated growth of thyroid cells that forms a lump in your thyroid gland.  Thyroid nodules are common. Most are not cancerous (benign). You may have one nodule or  several nodules.  Treatment depends on the cause and size of your nodule or nodules. If the nodule is benign, treatment may not be necessary.  Your health care provider may monitor the nodule to see if it goes away without treatment. If the nodule continues to grow, is cancerous, or does not go away, treatment may be needed. This information is not intended to replace advice given to you by your health care provider. Make sure you discuss any questions you have with your health care provider. Document Released: 02/04/2004 Document Revised: 10/26/2017 Document Reviewed: 10/29/2017 Elsevier Patient Education  2020 [employer].

## 2019-02-24 NOTE — Progress Notes (Signed)
DM/BP/Chol/Thyroid follow up. Is fasting today.  Checks FSBS fasting daily. Readings have been in the 130s-140s. Denies nausea, vomiting, numbness/tingling in feet, polyuria, polydipsia.  Declines flu shot.

## 2019-02-24 NOTE — Progress Notes (Signed)
Established Patient Office Visit  Subjective:  Patient ID: Brenda Fleming, female    DOB: January 07, 1961  Age: 58 y.o. MRN: [id number]  CC:  Chief Complaint  Patient presents with  . Diabetes  . Hypertension  . Hyperlipidemia  . Hypothyroidism    HPI Brenda Fleming, 58 year old female, who presents in follow-up of chronic medical issues including diabetes, hypertension, hyperlipidemia and goiter.  She reports that her home blood sugars have been controlled and are generally in the 130s or less fasting.  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  She reports that she is staying busy and she has also completing a doctorate in religious studies.  She also is a Theme park manager.  She reports no issues with blurred vision, increased thirst or urinary frequency related to her diabetes.  She denies any numbness or tingling in her hands or feet.        She reports that she is taking her blood pressure medication daily and denies headaches or dizziness related to her blood pressure.  She is taking her medicines for diabetes but sometimes skips her evening dose of one of the medications (possibly glipizide) as she states that she normally only eats a light meal for dinner and if she takes the medication before eating and does not eat very much then she tends to have a decrease in her blood sugars below normal.  She reports that this will cause her to not feel well.  She will then have to eat more food before going to bed.         She reports that she has had the thyroid mass/goiter for many years.  She reports that she is afraid to have any surgery or intervention regarding her thyroid is she states that she had a family member who was of a fairly young age who had thyroid nodule and this turned out to be cancerous.  Patient denies any symptoms of hypo or hyperthyroidism-no unexplained weight loss or weight gain, no palpitations or increased heart rate.  No  peripheral edema.  No constipation.  No significant fatigue.  She denies any shortness of breath or sensation of decreased ability to breathe when lying down due to the weight of the thyroid.  She does admit that she sometimes feels as if it is harder to swallow food as food feels as if it slows down or sometimes gets stuck in the area of the thyroid mass and she has to swallow again or drink fluids.  He states that may be she will follow-up with a thyroid doctor that she has seen in the past.  She was unable to remember this doctor's name or give further information.  Past Medical History:  Diagnosis Date  . Diabetes mellitus without complication (HCC)    Type 2  . GERD (gastroesophageal reflux disease)   . Goiter   . Hyperlipidemia associated with type 2 diabetes mellitus ([place])   . Hypertension associated with diabetes ([place])   . Hypothyroidism   . Obesity     Past Surgical History:  Procedure Laterality Date  . ABDOMINAL HYSTERECTOMY    . RHINOPLASTY    . TUBAL LIGATION      Family History  Problem Relation Age of Onset  . Hypertension Mother   . Anemia Mother   . Heart disease Father   . Diabetes Brother   . Heart disease Brother   . Diabetes Brother   .  Diabetes Brother   . Colon cancer Neg Hx   . Stroke Neg Hx   . Cancer Neg Hx     Social History   Socioeconomic History  . Marital status: Married    Spouse name: Not on file  . Number of children: Not on file  . Years of education: Not on file  . Highest education level: Not on file  Occupational History  . Not on file  Social Needs  . Financial resource strain: Not on file  . Food insecurity    Worry: Not on file    Inability: Not on file  . Transportation needs    Medical: Not on file    Non-medical: Not on file  Tobacco Use  . Smoking status: Never Smoker  . Smokeless tobacco: Never Used  Substance and Sexual Activity  . Alcohol use: Yes    Alcohol/week: 0.0 standard drinks    Comment: very  occasionally  . Drug use: No  . Sexual activity: Yes    Birth control/protection: Surgical  Lifestyle  . Physical activity    Days per week: Not on file    Minutes per session: Not on file  . Stress: Not on file  Relationships  . Social Herbalist on phone: Not on file    Gets together: Not on file    Attends religious service: Not on file    Active member of club or organization: Not on file    Attends meetings of clubs or organizations: Not on file    Relationship status: Not on file  . Intimate partner violence    Fear of current or ex partner: Not on file    Emotionally abused: Not on file    Physically abused: Not on file    Forced sexual activity: Not on file  Other Topics Concern  . Not on file  Social History Narrative  . Not on file    Outpatient Medications Prior to Visit  Medication Sig Dispense Refill  . amLODipine (NORVASC) 5 MG tablet Take 1 tablet (5 mg total) by mouth daily. 30 tablet 0  . atorvastatin (LIPITOR) 40 MG tablet Take 1 tablet (40 mg total) by mouth daily. 30 tablet 0  . glipiZIDE (GLUCOTROL) 10 MG tablet Take 1 tablet (10 mg total) by mouth 2 (two) times daily before a meal. 60 tablet 0  . lisinopril (ZESTRIL) 40 MG tablet Take 1 tablet (40 mg total) by mouth daily. 30 tablet 0  . Melatonin 3 MG TABS Take 1 tablet by mouth at bedtime.    . metFORMIN (GLUCOPHAGE-XR) 500 MG 24 hr tablet Take 2 tablets (1,000 mg total) by mouth 2 (two) times daily with a meal. 120 tablet 0  . Multiple Vitamin (MULTIVITAMIN WITH MINERALS) TABS Take 1 tablet by mouth daily. Reported on 06/03/2015    . aspirin 325 MG EC tablet Take 325 mg by mouth at bedtime.     No facility-administered medications prior to visit.     Allergies  Allergen Reactions  . Sulfonamide Derivatives     unknown    ROS Review of Systems  Constitutional: Positive for fatigue (Mild). Negative for chills and fever.  HENT: Positive for trouble swallowing (Occasional sensation of  sticking of food/slow movement of food in area of goiter with swallowing). Negative for sore throat.   Eyes: Negative for photophobia and visual disturbance.  Respiratory: Negative for cough and shortness of breath.   Cardiovascular: Negative for chest pain, palpitations and  leg swelling.  Gastrointestinal: Negative for abdominal pain, blood in stool, constipation and diarrhea.  Endocrine: Negative for polydipsia, polyphagia and polyuria.  Genitourinary: Negative for dysuria and frequency.  Musculoskeletal: Negative for back pain, neck pain and neck stiffness.  Skin: Negative for rash and wound.  Neurological: Negative for dizziness and headaches.  Hematological: Negative for adenopathy. Does not bruise/bleed easily.  Psychiatric/Behavioral: Negative for self-injury and suicidal ideas. The patient is nervous/anxious (Occasional).       Objective:    Physical Exam  Constitutional: She is oriented to person, place, and time. She appears well-developed and well-nourished.  Well-nourished well-developed older female in no acute distress.  Patient is wearing face mask as per office COVID-19 protocol.  Patient with visible larger than golf ball sized right mid to lower neck mass/goiter  Neck: Normal range of motion. Neck supple. No JVD present.  Right-sided thyroid area mass/goiter  Cardiovascular: Normal rate and regular rhythm.  Pulmonary/Chest: Effort normal and breath sounds normal.  Abdominal: Soft. There is no abdominal tenderness. There is no rebound and no guarding.  Musculoskeletal:        General: No tenderness or edema.  Lymphadenopathy:    She has no cervical adenopathy.  Neurological: She is alert and oriented to person, place, and time.  Skin: Skin is warm and dry.  Psychiatric: She has a normal mood and affect. Her behavior is normal.  Nursing note and vitals reviewed. Diabetic foot exam-no active skin breakdown on the feet, 1+ posterior tibial and dorsalis pedis pulses.   Normal monofilament exam.  Patient with some areas of thickening and discoloration on the great and second toes, mild onychomycosis.  Small bunion on right foot.  BP 117/79   Pulse 93   Temp 97.7 F (36.5 C) (Temporal)   Resp 17   Ht 5\' 4"  (1.626 m)   Wt 189 lb (85.7 kg)   SpO2 96%   BMI 32.44 kg/m  Wt Readings from Last 3 Encounters:  02/24/19 189 lb (85.7 kg)  08/05/18 195 lb 9.6 oz (88.7 kg)  06/03/15 198 lb (89.8 kg)     Health Maintenance Due  Topic Date Due  . HIV Screening  06/22/1973  . OPHTHALMOLOGY EXAM  03/16/2017  . FOOT EXAM  06/02/2018  . HEMOGLOBIN A1C  02/05/2019   Patient had DM foot exam at today's visit and agreed to have influenza immunization   Lab Results  Component Value Date   TSH 0.768 10/18/2018   Lab Results  Component Value Date   WBC 6.4 08/05/2018   HGB 13.1 08/05/2018   HCT 38.9 08/05/2018   MCV 74 (L) 08/05/2018   PLT 346 08/05/2018   Lab Results  Component Value Date   NA 138 08/05/2018   K 4.4 08/05/2018   CO2 20 08/05/2018   GLUCOSE 149 (H) 08/05/2018   BUN 9 08/05/2018   CREATININE 0.68 08/05/2018   BILITOT 0.2 08/05/2018   ALKPHOS 106 08/05/2018   AST 12 08/05/2018   ALT 18 08/05/2018   PROT 7.3 08/05/2018   ALBUMIN 4.3 08/05/2018   CALCIUM 9.4 08/05/2018   Lab Results  Component Value Date   CHOL 173 08/05/2018   Lab Results  Component Value Date   HDL 41 08/05/2018   Lab Results  Component Value Date   LDLCALC 110 (H) 08/05/2018   Lab Results  Component Value Date   TRIG 109 08/05/2018   Lab Results  Component Value Date   CHOLHDL 4.2 08/05/2018   Lab  Results  Component Value Date   HGBA1C 7.1 (H) 08/05/2018      Assessment & Plan:  1. Type 2 diabetes mellitus without complication, without long-term current use of insulin ([place]) Patient believes her blood sugars are well controlled.  She states that she sometimes skips evening dose of glipizide if her sugars are already low or if she is having a  light meal.  She states that her breakfast and sometimes lunch are generally the larger meals of the day. Blood sugar was 139 nonfasting at today's visit.  She is encouraged to continue low carbohydrate diet and regular exercise as tolerated.  She will also have comprehensive metabolic panel as well as urine microalbumin creatinine ratio.  Refills provided of her Metformin and glipizide. - Glucose (CBG) - Hemoglobin A1c - Comprehensive metabolic panel - Microalbumin/Creatinine Ratio, Urine - metFORMIN (GLUCOPHAGE-XR) 500 MG 24 hr tablet; Take 2 tablets (1,000 mg total) by mouth 2 (two) times daily with a meal.  Dispense: 360 tablet; Refill: 1 - glipiZIDE (GLUCOTROL) 10 MG tablet; Take 1 tablet (10 mg total) by mouth 2 (two) times daily before a meal. Decrease to 1/2 pill before evening meal if meal is light  Dispense: 180 tablet; Refill: 1  2. Essential hypertension Continue use of amlodipine and lisinopril.  Blood pressure stable and well-controlled at today's visit and is at target of 130/80 or less.  Continue low-sodium diet and exercise.  Will check microalbumin creatinine ratio as well as electrolytes as part of CMP. - Microalbumin/Creatinine Ratio, Urine - amLODipine (NORVASC) 5 MG tablet; Take 1 tablet (5 mg total) by mouth daily.  Dispense: 90 tablet; Refill: 1 - lisinopril (ZESTRIL) 40 MG tablet; Take 1 tablet (40 mg total) by mouth daily.  Dispense: 90 tablet; Refill: 1  3. Hyperlipidemia, unspecified hyperlipidemia type Patient with lipid panel done 08/05/2018 with LDL of 110 and discussed LDL goal of 70 or less as patient is diabetic and diabetes can increase risk of heart disease.  Low-fat diet and regular exercise encouraged and patient provided with refill of atorvastatin - Comprehensive metabolic panel - atorvastatin (LIPITOR) 40 MG tablet; Take 1 tablet (40 mg total) by mouth daily.  Dispense: 90 tablet; Refill: 1  4. Encounter for long-term (current) use of medications We will  perform comprehensive metabolic panel in follow-up of patient's diabetes as well as long-term use of medications for treatment of diabetes, hypertension and hyperlipidemia including use of statin medication. - Comprehensive metabolic panel  5. Abnormal serum thyroid stimulating hormone (TSH) level On review of chart, patient has had past decrease in TSH but otherwise normal thyroid panel and most recent TSH done 10/18/2018 was low normal.  We will repeat TSH and T4 today's visit and she will be notified if any recommendations for further evaluation, testing or medication are needed based on the results. - T4 AND TSH  6. Multinodular goiter On review of chart, patient has not had thyroid ultrasound since April 2016 despite being previously scheduled for thyroid ultrasound more recently.  At that time, patient's ultrasound showed multinodular goiter.  We will schedule patient for repeat thyroid ultrasound to make sure that she does not have any suspicious nodules suggestive of malignancy or need for further evaluation by biopsy.  She will also have T4 and TSH to today's visit to look for thyroid disorder. - US THYROID; Future  7. Needs flu shot Influenza immunization discussed with the patient as she is at increased risk due to her age and underlying medical conditions  in addition to current COVID-19 pandemic.  Patient agreed to have influenza immunization at today's visit and she was provided with educational material regarding the immunization - Flu Vaccine QUAD 6+ mos PF IM (Fluarix Quad PF)  8. Noncompliance with diagnostic testing On review of chart, patient has outstanding order for thyroid ultrasound.  When discussing this with the medical assistant, she reports that patient has been scheduled at least twice previously for thyroid ultrasound and follow-up of thyroid mass/goiter and each time, patient has canceled the imaging appointment and per CMA, patient states that patient's reason for  canceling the ultrasound was that she really did not want to know if anything serious was going on.  Importance of follow-up of goiter/thyroid mass was discussed with patient at today's visit and her ultrasound will be rescheduled.  An After Visit Summary was printed and given to the patient.  Follow-up: Return in about 5 months (around 07/25/2019) for chronic issues and as needed.   Antony Blackbird, MD

## 2019-02-25 LAB — MICROALBUMIN / CREATININE URINE RATIO
Creatinine, Urine: 158.7 mg/dL
Microalb/Creat Ratio: 12 mg/g{creat} (ref 0–29)
Microalbumin, Urine: 19.4 ug/mL

## 2019-02-28 ENCOUNTER — Other Ambulatory Visit: Payer: Self-pay | Admitting: Family Medicine

## 2019-03-01 LAB — COMPREHENSIVE METABOLIC PANEL WITH GFR
ALT: 22 IU/L (ref 0–32)
AST: 11 IU/L (ref 0–40)
Albumin/Globulin Ratio: 1.5 (ref 1.2–2.2)
Albumin: 4.6 g/dL (ref 3.8–4.9)
Alkaline Phosphatase: 111 IU/L (ref 39–117)
BUN/Creatinine Ratio: 11 — ABNORMAL LOW (ref 12–28)
BUN: 9 mg/dL (ref 8–27)
Bilirubin Total: 0.3 mg/dL (ref 0.0–1.2)
CO2: 24 mmol/L (ref 20–29)
Calcium: 9.6 mg/dL (ref 8.7–10.3)
Chloride: 102 mmol/L (ref 96–106)
Creatinine, Ser: 0.81 mg/dL (ref 0.57–1.00)
GFR calc Af Amer: 91 mL/min/1.73
GFR calc non Af Amer: 79 mL/min/1.73
Globulin, Total: 3 g/dL (ref 1.5–4.5)
Glucose: 136 mg/dL — ABNORMAL HIGH (ref 65–99)
Potassium: 4.6 mmol/L (ref 3.5–5.2)
Sodium: 140 mmol/L (ref 134–144)
Total Protein: 7.6 g/dL (ref 6.0–8.5)

## 2019-03-01 LAB — T4 AND TSH
T4, Total: 8.4 ug/dL (ref 4.5–12.0)
TSH: 0.592 u[IU]/mL (ref 0.450–4.500)

## 2019-03-01 LAB — HEMOGLOBIN A1C
Est. average glucose Bld gHb Est-mCnc: 151 mg/dL
Hgb A1c MFr Bld: 6.9 % — ABNORMAL HIGH (ref 4.8–5.6)

## 2019-07-21 ENCOUNTER — Telehealth (INDEPENDENT_AMBULATORY_CARE_PROVIDER_SITE_OTHER): Payer: BC Managed Care – PPO | Admitting: Internal Medicine

## 2019-07-21 ENCOUNTER — Encounter: Payer: Self-pay | Admitting: Internal Medicine

## 2019-07-21 DIAGNOSIS — E785 Hyperlipidemia, unspecified: Secondary | ICD-10-CM

## 2019-07-21 DIAGNOSIS — I10 Essential (primary) hypertension: Secondary | ICD-10-CM | POA: Diagnosis not present

## 2019-07-21 DIAGNOSIS — Z1211 Encounter for screening for malignant neoplasm of colon: Secondary | ICD-10-CM | POA: Diagnosis not present

## 2019-07-21 DIAGNOSIS — Z1231 Encounter for screening mammogram for malignant neoplasm of breast: Secondary | ICD-10-CM

## 2019-07-21 DIAGNOSIS — Z114 Encounter for screening for human immunodeficiency virus [HIV]: Secondary | ICD-10-CM

## 2019-07-21 DIAGNOSIS — E119 Type 2 diabetes mellitus without complications: Secondary | ICD-10-CM | POA: Diagnosis not present

## 2019-07-21 MED ORDER — ATORVASTATIN CALCIUM 40 MG PO TABS: 40.0000 mg | ORAL_TABLET | Freq: Every day | ORAL | 1 refills | Status: DC

## 2019-07-21 MED ORDER — METFORMIN HCL ER 500 MG PO TB24: 1000.0000 mg | ORAL_TABLET | Freq: Two times a day (BID) | ORAL | 1 refills | Status: DC

## 2019-07-21 MED ORDER — LISINOPRIL 40 MG PO TABS: 40.0000 mg | ORAL_TABLET | Freq: Every day | ORAL | 1 refills | Status: DC

## 2019-07-21 MED ORDER — GLIPIZIDE 10 MG PO TABS: 10.0000 mg | ORAL_TABLET | Freq: Two times a day (BID) | ORAL | 1 refills | Status: DC

## 2019-07-21 MED ORDER — AMLODIPINE BESYLATE 5 MG PO TABS: 5.0000 mg | ORAL_TABLET | Freq: Every day | ORAL | 1 refills | Status: DC

## 2019-07-21 NOTE — Progress Notes (Signed)
Virtual Visit via Telephone Note  I connected with Brenda Fleming, on 07/21/2019 at 8:51 AM by telephone due to the COVID-19 pandemic and verified that I am speaking with the correct person using two identifiers.   Consent: I discussed the limitations, risks, security and privacy concerns of performing an evaluation and management service by telephone and the availability of in person appointments. I also discussed with the patient that there may be a patient responsible charge related to this service. The patient expressed understanding and agreed to proceed.   Location of Patient: Home   Location of Provider: Clinic    Persons participating in Telemedicine visit: Brenda Fleming [facility] Dr. Juleen China      History of Present Illness: Patient has f/u for chronic medical conditions.   Chronic HTN Disease Monitoring:  Home BP Monitoring - Does not monitor.  Chest pain- no  Dyspnea- no Headache - no  Medications: Amlodipine 5 mg, Lisinopril 40 mg  Compliance- yes Lightheadedness- no  Edema- no    Diabetes mellitus, Type 2 Disease Monitoring             Blood Sugar Ranges: Fasting - 89 this AM up to 130s, although her husband uses her meter and she can't tell which ones are his so doesn't know her average              Polyuria: no              Visual problems: no   Urine Microalbumin 12 (Nov 2020), on Ace Inhibitor  Last A1C: 6.9 (Dec 2020)   Medications: Metformin 1000 mg BID, Glipizide 10 mg BID  Medication Compliance: yes  Medication Side Effects             Hypoglycemia: no       Past Medical History:  Diagnosis Date  . Diabetes mellitus without complication (HCC)    Type 2  . GERD (gastroesophageal reflux disease)   . Goiter   . Hyperlipidemia associated with type 2 diabetes mellitus ([place])   . Hypertension associated with diabetes ([place])   . Hypothyroidism   . Obesity    Allergies  Allergen Reactions  . Sulfonamide  Derivatives     unknown    Current Outpatient Medications on File Prior to Visit  Medication Sig Dispense Refill  . amLODipine (NORVASC) 5 MG tablet Take 1 tablet (5 mg total) by mouth daily. 90 tablet 1  . atorvastatin (LIPITOR) 40 MG tablet Take 1 tablet (40 mg total) by mouth daily. 90 tablet 1  . glipiZIDE (GLUCOTROL) 10 MG tablet Take 1 tablet (10 mg total) by mouth 2 (two) times daily before a meal. Decrease to 1/2 pill before evening meal if meal is light 180 tablet 1  . lisinopril (ZESTRIL) 40 MG tablet Take 1 tablet (40 mg total) by mouth daily. 90 tablet 1  . Melatonin 3 MG TABS Take 1 tablet by mouth at bedtime.    . metFORMIN (GLUCOPHAGE-XR) 500 MG 24 hr tablet Take 2 tablets (1,000 mg total) by mouth 2 (two) times daily with a meal. 360 tablet 1  . Multiple Vitamin (MULTIVITAMIN WITH MINERALS) TABS Take 1 tablet by mouth daily. Reported on 06/03/2015     No current facility-administered medications on file prior to visit.    Observations/Objective: NAD. Speaking clearly.  Work of breathing normal.  Alert and oriented. Mood appropriate.   Assessment and Plan: 1. Type 2 diabetes mellitus without complication, without long-term current use of insulin (  [place]) Fasting CBGs sound well controlled. Her last A1c is at goal. Pending repeat A1c, may attempt to decrease Glipizide dose to lessen hypoglycemia risk.  Counseled on Diabetic diet, my plate method, [id number] minutes of moderate intensity exercise/week Blood sugar logs with fasting goals of 80-120 mg/dl, random of less than 180 and in the event of sugars less than 60 mg/dl or greater than 400 mg/dl encouraged to notify the clinic. Advised on the need for annual eye exams, annual foot exams, Pneumonia vaccine. - Hemoglobin A1c; Future - Ambulatory referral to Ophthalmology - glipiZIDE (GLUCOTROL) 10 MG tablet; Take 1 tablet (10 mg total) by mouth 2 (two) times daily before a meal. Decrease to 1/2 pill before evening meal if meal is light   Dispense: 180 tablet; Refill: 1 - metFORMIN (GLUCOPHAGE-XR) 500 MG 24 hr tablet; Take 2 tablets (1,000 mg total) by mouth 2 (two) times daily with a meal.  Dispense: 360 tablet; Refill: 1  2. Essential hypertension Unsure how BP trend is running at home. Prior BP trend looks great in the office.  Counseled on blood pressure goal of less than 130/80, low-sodium, DASH diet, medication compliance, 150 minutes of moderate intensity exercise per week. Discussed medication compliance, adverse effects.  - amLODipine (NORVASC) 5 MG tablet; Take 1 tablet (5 mg total) by mouth daily.  Dispense: 90 tablet; Refill: 1 - lisinopril (ZESTRIL) 40 MG tablet; Take 1 tablet (40 mg total) by mouth daily.  Dispense: 90 tablet; Refill: 1  3. Breast cancer screening by mammogram - MM Digital Screening; Future  4. Colon cancer screening - Ambulatory referral to Gastroenterology  5. Screening for HIV (human immunodeficiency virus) - HIV antibody (with reflex); Future  6. Hyperlipidemia, unspecified hyperlipidemia type Prior LDL slightly elevated at 110. Will plan to repeat LDL.  - atorvastatin (LIPITOR) 40 MG tablet; Take 1 tablet (40 mg total) by mouth daily.  Dispense: 90 tablet; Refill: 1 - Lipid panel; Future   Follow Up Instructions: Lab visit with [facility], 3 month f/u for chronic medical conditions    I discussed the assessment and treatment plan with the patient. The patient was provided an opportunity to ask questions and all were answered. The patient agreed with the plan and demonstrated an understanding of the instructions.   The patient was advised to call back or seek an in-person evaluation if the symptoms worsen or if the condition fails to improve as anticipated.  I provided 18 minutes total of non-face-to-face time during this encounter including median intraservice time, reviewing previous notes, investigations, ordering medications, medical decision making, coordinating care and patient  verbalized understanding at the end of the visit.    Phill Myron, D.O. Primary Care at [facility]  07/21/2019, 8:51 AM

## 2019-07-25 ENCOUNTER — Other Ambulatory Visit: Payer: Self-pay | Admitting: Internal Medicine

## 2019-07-26 LAB — HEMOGLOBIN A1C
Est. average glucose Bld gHb Est-mCnc: 151 mg/dL
Hgb A1c MFr Bld: 6.9 % — ABNORMAL HIGH (ref 4.8–5.6)

## 2019-07-26 LAB — LIPID PANEL
Chol/HDL Ratio: 3.3 ratio (ref 0.0–4.4)
Cholesterol, Total: 124 mg/dL (ref 100–199)
HDL: 38 mg/dL — ABNORMAL LOW (ref >39)
LDL Chol Calc (NIH): 69 mg/dL (ref 0–99)
Triglycerides: 90 mg/dL (ref 0–149)
VLDL Cholesterol Cal: 17 mg/dL (ref 5–40)

## 2019-07-26 LAB — HIV ANTIBODY (ROUTINE TESTING W REFLEX): HIV Screen 4th Generation wRfx: NONREACTIVE

## 2019-07-31 ENCOUNTER — Other Ambulatory Visit: Payer: Self-pay | Admitting: Internal Medicine

## 2019-07-31 DIAGNOSIS — E119 Type 2 diabetes mellitus without complications: Secondary | ICD-10-CM

## 2019-07-31 MED ORDER — GLIPIZIDE 5 MG PO TABS: 5.0000 mg | ORAL_TABLET | Freq: Two times a day (BID) | ORAL | 3 refills | Status: DC

## 2019-08-01 ENCOUNTER — Telehealth: Payer: Self-pay | Admitting: Internal Medicine

## 2019-08-01 NOTE — Telephone Encounter (Signed)
Pt called for labs informed her you would call back Monday or Tuesday.

## 2019-08-08 NOTE — Progress Notes (Signed)
Patient notified of results & recommendations. Expressed understanding.

## 2019-12-15 ENCOUNTER — Other Ambulatory Visit: Payer: Self-pay

## 2019-12-15 DIAGNOSIS — E119 Type 2 diabetes mellitus without complications: Secondary | ICD-10-CM

## 2019-12-15 MED ORDER — GLIPIZIDE 5 MG PO TABS: 5.0000 mg | ORAL_TABLET | Freq: Two times a day (BID) | ORAL | 0 refills | Status: DC

## 2020-01-17 ENCOUNTER — Other Ambulatory Visit: Payer: Self-pay

## 2020-01-17 ENCOUNTER — Ambulatory Visit
Admission: RE | Admit: 2020-01-17 | Discharge: 2020-01-17 | Disposition: A | Discharge status: Home / Self Care | Payer: BC Managed Care – PPO | Source: Ambulatory Visit | Attending: Internal Medicine | Admitting: Internal Medicine

## 2020-01-17 DIAGNOSIS — Z1231 Encounter for screening mammogram for malignant neoplasm of breast: Secondary | ICD-10-CM

## 2020-02-26 ENCOUNTER — Telehealth: Payer: Self-pay

## 2020-02-26 DIAGNOSIS — E785 Hyperlipidemia, unspecified: Secondary | ICD-10-CM

## 2020-02-26 DIAGNOSIS — E119 Type 2 diabetes mellitus without complications: Secondary | ICD-10-CM

## 2020-02-26 DIAGNOSIS — I10 Essential (primary) hypertension: Secondary | ICD-10-CM

## 2020-02-26 MED ORDER — AMLODIPINE BESYLATE 5 MG PO TABS: 5.0000 mg | ORAL_TABLET | Freq: Every day | ORAL | 0 refills | Status: DC

## 2020-02-26 MED ORDER — ATORVASTATIN CALCIUM 40 MG PO TABS: 40.0000 mg | ORAL_TABLET | Freq: Every day | ORAL | 0 refills | Status: DC

## 2020-02-26 MED ORDER — METFORMIN HCL ER 500 MG PO TB24: 1000.0000 mg | ORAL_TABLET | Freq: Two times a day (BID) | ORAL | 0 refills | Status: DC

## 2020-02-26 MED ORDER — GLIPIZIDE 5 MG PO TABS: 5.0000 mg | ORAL_TABLET | Freq: Two times a day (BID) | ORAL | 0 refills | Status: DC

## 2020-02-26 MED ORDER — LISINOPRIL 40 MG PO TABS: 40.0000 mg | ORAL_TABLET | Freq: Every day | ORAL | 0 refills | Status: DC

## 2020-02-26 NOTE — Telephone Encounter (Signed)
Patient hasn't been seen since April, when she was told to follow up in 3 months. Please make an in person appointment for DM/BP/Chol f/up.  Will send short supply once appointment has been made.

## 2020-02-26 NOTE — Telephone Encounter (Signed)
1) Medication(s) Requested (by name):atorvastatin (LIPITOR) 40 MG tablet amLODipine (NORVASC) 5 MG tablet [[id number]] lisinopril (ZESTRIL) 40 MG tablet [[id number]  glipiZIDE (GLUCOTROL) 5 MG tablet   2) Pharmacy of Choice:[id number] - [place], [place], [place]   3) Special Requests:   Approved medications will be sent to the pharmacy, we will reach out if there is an issue.  Requests made after 3pm may not be addressed until the following business day!  If a patient is unsure of the name of the medication(s) please note and ask patient to call back when they are able to provide all info, do not send to responsible party until all information is available!

## 2020-02-26 NOTE — Telephone Encounter (Signed)
PT HAS AN APPOINTMENT FOR DEC 14 AT 1:30

## 2020-02-26 NOTE — Telephone Encounter (Signed)
Refills sent

## 2020-03-03 DIAGNOSIS — E1169 Type 2 diabetes mellitus with other specified complication: Secondary | ICD-10-CM | POA: Insufficient documentation

## 2020-03-03 DIAGNOSIS — E785 Hyperlipidemia, unspecified: Secondary | ICD-10-CM | POA: Insufficient documentation

## 2020-03-09 ENCOUNTER — Other Ambulatory Visit: Payer: Self-pay

## 2020-03-09 ENCOUNTER — Encounter: Payer: Self-pay | Admitting: Internal Medicine

## 2020-03-09 ENCOUNTER — Ambulatory Visit (INDEPENDENT_AMBULATORY_CARE_PROVIDER_SITE_OTHER): Payer: BC Managed Care – PPO | Admitting: Internal Medicine

## 2020-03-09 VITALS — BP 116/75 | HR 99 | Temp 98.1°F | Resp 17 | Wt 188.0 lb

## 2020-03-09 DIAGNOSIS — E785 Hyperlipidemia, unspecified: Secondary | ICD-10-CM

## 2020-03-09 DIAGNOSIS — I10 Essential (primary) hypertension: Secondary | ICD-10-CM

## 2020-03-09 DIAGNOSIS — E119 Type 2 diabetes mellitus without complications: Secondary | ICD-10-CM | POA: Diagnosis not present

## 2020-03-09 DIAGNOSIS — E1169 Type 2 diabetes mellitus with other specified complication: Secondary | ICD-10-CM | POA: Diagnosis not present

## 2020-03-09 LAB — POCT GLYCOSYLATED HEMOGLOBIN (HGB A1C): Hemoglobin A1C: 6.5 % — AB (ref 4.0–5.6)

## 2020-03-09 MED ORDER — LISINOPRIL 40 MG PO TABS: 40.0000 mg | ORAL_TABLET | Freq: Every day | ORAL | 1 refills | Status: DC

## 2020-03-09 MED ORDER — AMLODIPINE BESYLATE 5 MG PO TABS: 5.0000 mg | ORAL_TABLET | Freq: Every day | ORAL | 1 refills | Status: DC

## 2020-03-09 MED ORDER — METFORMIN HCL ER 500 MG PO TB24: 1000.0000 mg | ORAL_TABLET | Freq: Two times a day (BID) | ORAL | 1 refills | Status: DC

## 2020-03-09 MED ORDER — ATORVASTATIN CALCIUM 40 MG PO TABS: 40.0000 mg | ORAL_TABLET | Freq: Every day | ORAL | 1 refills | Status: DC

## 2020-03-09 MED ORDER — GLIPIZIDE 5 MG PO TABS: 5.0000 mg | ORAL_TABLET | Freq: Two times a day (BID) | ORAL | 1 refills | Status: DC

## 2020-03-09 NOTE — Progress Notes (Signed)
Subjective:    Brenda Fleming - 59 y.o. female MRN [id number]  Date of birth: [birth year]  HPI  Brenda Fleming is here for follow up of chronic medical conditions.  Chronic HTN Disease Monitoring:  Home BP Monitoring - Not monitoring  Chest pain- no  Dyspnea- no Headache - no  Medications: Amlodipine 5 mg, Lisinopril 40 mg Compliance- yes Lightheadedness- no  Edema- no   Diabetes mellitus, Type 2 Disease Monitoring             Blood Sugar Ranges: Fasting - Mostly around 110s              Polyuria: no             Visual problems: no   Urine Microalbumin 12 (Nov 2020)   Last A1C: 6.9 (April 2021)   Medications: Metformin 1000 mg BID, Glipizide 5 mg BID  Medication Compliance: yes  Medication Side Effects             Hypoglycemia: no     Health Maintenance:  Health Maintenance Due  Topic Date Due  . OPHTHALMOLOGY EXAM  03/16/2017  . FOOT EXAM  06/02/2018  . HEMOGLOBIN A1C  01/24/2020    -  reports that she has never smoked. She has never used smokeless tobacco. - Review of Systems: Per HPI. - Past Medical History: Patient Active Problem List   Diagnosis Date Noted  . Hyperlipidemia associated with type 2 diabetes mellitus ([place]) 03/03/2020  . Thyroid enlargement 02/03/2012  . DM (diabetes mellitus) ([place]) 02/03/2012  . FIBROIDS, UTERUS 05/02/2007  . DIABETES MELLITUS, TYPE II 05/02/2007  . MIXED DISORDERS AS REACTION TO STRESS 05/02/2007  . PERIMENOPAUSAL STATUS 05/02/2007   - Medications: reviewed and updated   Objective:   Physical Exam BP 116/75   Pulse 99   Temp 98.1 F (36.7 C) (Temporal)   Resp 17   Wt 188 lb (85.3 kg)   SpO2 97%   BMI 32.27 kg/m  Physical Exam Constitutional:      General: She is not in acute distress.    Appearance: She is not diaphoretic.  Cardiovascular:     Rate and Rhythm: Normal rate.  Pulmonary:     Effort: Pulmonary effort is normal. No respiratory distress.  Musculoskeletal:        General:  Normal range of motion.  Skin:    General: Skin is warm and dry.  Neurological:     Mental Status: She is alert and oriented to person, place, and time.  Psychiatric:        Mood and Affect: Affect normal.        Judgment: Judgment normal.            Assessment & Plan:   1. Type 2 diabetes mellitus without complication, without long-term current use of insulin (HCC) A1c 6.5, good glycemic control. Discussed option of discontinuing Glipizide. However, patient would prefer to stay on medication and not having episodes of hypoglycemia so will continue. Follow up in 6 months.  - HgB A1c - HM Diabetes Foot Exam - glipiZIDE (GLUCOTROL) 5 MG tablet; Take 1 tablet (5 mg total) by mouth 2 (two) times daily before a meal. Decrease to 1/2 pill before evening meal if meal is light  Dispense: 180 tablet; Refill: 1 - metFORMIN (GLUCOPHAGE-XR) 500 MG 24 hr tablet; Take 2 tablets (1,000 mg total) by mouth 2 (two) times daily with a meal.  Dispense: 360 tablet; Refill: 1 - Comprehensive metabolic panel; Future -  Microalbumin/Creatinine Ratio, Urine; Future  2. Essential hypertension BP at goal today. Asymptomatic. Continue current regimen.  - amLODipine (NORVASC) 5 MG tablet; Take 1 tablet (5 mg total) by mouth daily.  Dispense: 90 tablet; Refill: 1 - lisinopril (ZESTRIL) 40 MG tablet; Take 1 tablet (40 mg total) by mouth daily.  Dispense: 90 tablet; Refill: 1 - CBC with Differential; Future - Comprehensive metabolic panel; Future  3. Hyperlipidemia associated with type 2 diabetes mellitus (HCC) - atorvastatin (LIPITOR) 40 MG tablet; Take 1 tablet (40 mg total) by mouth daily.  Dispense: 90 tablet; Refill: Ramona, D.O. 03/09/2020, 1:52 PM Primary Care at [facility]

## 2020-03-11 ENCOUNTER — Other Ambulatory Visit: Payer: Self-pay | Admitting: Internal Medicine

## 2020-03-12 LAB — COMPREHENSIVE METABOLIC PANEL
ALT: 19 IU/L (ref 0–32)
AST: 15 IU/L (ref 0–40)
Albumin/Globulin Ratio: 1.8 (ref 1.2–2.2)
Albumin: 4.8 g/dL (ref 3.8–4.8)
Alkaline Phosphatase: 93 IU/L (ref 44–121)
BUN/Creatinine Ratio: 12 (ref 12–28)
BUN: 9 mg/dL (ref 8–27)
Bilirubin Total: 0.4 mg/dL (ref 0.0–1.2)
CO2: 24 mmol/L (ref 20–29)
Calcium: 9.4 mg/dL (ref 8.7–10.3)
Chloride: 98 mmol/L (ref 96–106)
Creatinine, Ser: 0.73 mg/dL (ref 0.57–1.00)
GFR calc Af Amer: 103 mL/min/{1.73_m2} (ref >59)
GFR calc non Af Amer: 89 mL/min/{1.73_m2} (ref >59)
Globulin, Total: 2.7 g/dL (ref 1.5–4.5)
Glucose: 111 mg/dL — ABNORMAL HIGH (ref 65–99)
Potassium: 4.4 mmol/L (ref 3.5–5.2)
Sodium: 138 mmol/L (ref 134–144)
Total Protein: 7.5 g/dL (ref 6.0–8.5)

## 2020-03-12 LAB — CBC WITH DIFFERENTIAL/PLATELET
Basophils Absolute: 0 10*3/uL (ref 0.0–0.2)
Basos: 1 %
EOS (ABSOLUTE): 0.1 10*3/uL (ref 0.0–0.4)
Eos: 2 %
Hematocrit: 41.9 % (ref 34.0–46.6)
Hemoglobin: 13 g/dL (ref 11.1–15.9)
Immature Grans (Abs): 0 10*3/uL (ref 0.0–0.1)
Immature Granulocytes: 0 %
Lymphocytes Absolute: 2.4 10*3/uL (ref 0.7–3.1)
Lymphs: 40 %
MCH: 23.5 pg — ABNORMAL LOW (ref 26.6–33.0)
MCHC: 31 g/dL — ABNORMAL LOW (ref 31.5–35.7)
MCV: 76 fL — ABNORMAL LOW (ref 79–97)
Monocytes Absolute: 0.5 10*3/uL (ref 0.1–0.9)
Monocytes: 8 %
Neutrophils Absolute: 3 10*3/uL (ref 1.4–7.0)
Neutrophils: 49 %
Platelets: 346 10*3/uL (ref 150–450)
RBC: 5.53 x10E6/uL — ABNORMAL HIGH (ref 3.77–5.28)
RDW: 14.4 % (ref 11.7–15.4)
WBC: 6 10*3/uL (ref 3.4–10.8)

## 2020-03-12 LAB — MICROALBUMIN / CREATININE URINE RATIO
Creatinine, Urine: 45.1 mg/dL
Microalb/Creat Ratio: 18 mg/g creat (ref 0–29)
Microalbumin, Urine: 8 ug/mL

## 2020-03-16 NOTE — Progress Notes (Signed)
Normal lab letter mailed.

## 2020-06-23 ENCOUNTER — Encounter: Payer: Self-pay | Admitting: Internal Medicine

## 2020-07-08 ENCOUNTER — Ambulatory Visit: Payer: BC Managed Care – PPO | Admitting: Internal Medicine

## 2020-07-28 ENCOUNTER — Encounter: Payer: Self-pay | Admitting: Internal Medicine

## 2020-07-28 ENCOUNTER — Ambulatory Visit (INDEPENDENT_AMBULATORY_CARE_PROVIDER_SITE_OTHER): Payer: BC Managed Care – PPO | Admitting: Internal Medicine

## 2020-07-28 ENCOUNTER — Other Ambulatory Visit: Payer: Self-pay

## 2020-07-28 VITALS — BP 148/93 | HR 101 | Resp 16 | Wt 186.8 lb

## 2020-07-28 DIAGNOSIS — E785 Hyperlipidemia, unspecified: Secondary | ICD-10-CM | POA: Diagnosis not present

## 2020-07-28 DIAGNOSIS — I10 Essential (primary) hypertension: Secondary | ICD-10-CM

## 2020-07-28 DIAGNOSIS — E119 Type 2 diabetes mellitus without complications: Secondary | ICD-10-CM | POA: Diagnosis not present

## 2020-07-28 DIAGNOSIS — E1169 Type 2 diabetes mellitus with other specified complication: Secondary | ICD-10-CM | POA: Diagnosis not present

## 2020-07-28 LAB — GLUCOSE, POCT (MANUAL RESULT ENTRY): POC Glucose: 147 mg/dl — AB (ref 70–99)

## 2020-07-28 NOTE — Progress Notes (Signed)
  Subjective:    Brenda Fleming - 60 y.o. female MRN [id number]  Date of birth: 1960/09/09  HPI  Brenda Fleming is here for follow up of chronic medical conditions.  Diabetes mellitus, Type 2 Disease Monitoring             Blood Sugar Ranges: Fasting - 110s             Polyuria: no             Visual problems: no   Urine Microalbumin 18 (Dec 2021)---on Lisinopril   Last A1C: 6.5 (Dec 2021)   Medications: Metformin 1000 mg BID, Glipizide 5 mg BID  Medication Compliance: yes  Medication Side Effects             Hypoglycemia: no   Chronic HTN Disease Monitoring:  Home BP Monitoring - Not monitoring  Chest pain- no  Dyspnea- no Headache - no  Medications: Lisinopril 40 mg, Amlodipine 5 mg  Compliance- yes Lightheadedness- no  Edema- no     Health Maintenance:  Health Maintenance Due  Topic Date Due  . OPHTHALMOLOGY EXAM  03/16/2017  . TETANUS/TDAP  03/27/2020    -  reports that she has never smoked. She has never used smokeless tobacco. - Review of Systems: Per HPI. - Past Medical History: Patient Active Problem List   Diagnosis Date Noted  . Hyperlipidemia associated with type 2 diabetes mellitus ([place]) 03/03/2020  . Thyroid enlargement 02/03/2012  . DM (diabetes mellitus) ([place]) 02/03/2012  . FIBROIDS, UTERUS 05/02/2007  . DIABETES MELLITUS, TYPE II 05/02/2007  . MIXED DISORDERS AS REACTION TO STRESS 05/02/2007  . PERIMENOPAUSAL STATUS 05/02/2007   - Medications: reviewed and updated   Objective:   Physical Exam BP (!) 148/93   Pulse (!) 101   Resp 16   Wt 186 lb 12.8 oz (84.7 kg)   SpO2 97%   BMI 32.06 kg/m  Physical Exam Constitutional:      General: She is not in acute distress.    Appearance: She is not diaphoretic.  Cardiovascular:     Rate and Rhythm: Normal rate.  Pulmonary:     Effort: Pulmonary effort is normal. No respiratory distress.  Musculoskeletal:        General: Normal range of motion.  Skin:    General: Skin  is warm and dry.  Neurological:     Mental Status: She is alert and oriented to person, place, and time.  Psychiatric:        Mood and Affect: Affect normal.        Judgment: Judgment normal.            Assessment & Plan:   1. Type 2 diabetes mellitus without complication, without long-term current use of insulin ([place]) Prior A1c values well controlled since May 2020. Will obtain on send out lab to monitor. Continue current regimen.  - POCT glucose (manual entry) - Hemoglobin A1c  2. Hyperlipidemia associated with type 2 diabetes mellitus ([place]) - Lipid panel  3. Essential hypertension BP is just slightly above goal. Previously much better controlled so therefore will not change regimen today as patient is asymptomatic.      Phill Myron, D.O. 07/28/2020, 4:55 PM Primary Care at [facility]

## 2020-07-31 LAB — LIPID PANEL
Chol/HDL Ratio: 3.4 ratio (ref 0.0–4.4)
Cholesterol, Total: 146 mg/dL (ref 100–199)
HDL: 43 mg/dL (ref >39)
LDL Chol Calc (NIH): 84 mg/dL (ref 0–99)
Triglycerides: 102 mg/dL (ref 0–149)
VLDL Cholesterol Cal: 19 mg/dL (ref 5–40)

## 2020-07-31 LAB — HEMOGLOBIN A1C
Est. average glucose Bld gHb Est-mCnc: 151 mg/dL
Hgb A1c MFr Bld: 6.9 % — ABNORMAL HIGH (ref 4.8–5.6)

## 2020-08-12 ENCOUNTER — Telehealth (INDEPENDENT_AMBULATORY_CARE_PROVIDER_SITE_OTHER): Payer: Self-pay

## 2020-08-12 NOTE — Telephone Encounter (Signed)
-----   Message from Nicolette Bang, DO sent at 08/03/2020  9:48 AM EDT ----- Please call Darrick Meigs about results. Lipid panel looks great. Continue Lipitor. A1c has increased from 6.5 to 6.9 but still at goal of <7. Continue current medication regimen. Work on adhering to carb/sugar limited diet and aiming for 150 min exercise per week.   Thanks,  Phill Myron, D.O. Primary Care at [facility]  08/03/2020, 9:46 AM

## 2020-08-12 NOTE — Telephone Encounter (Signed)
Patient verified date of birth. She is aware that lipid panel looks good; continue taking lipitor. A1c increased from 6.5 to 6.9 but still at goal of less than 7; continue medication regimen. Advised to limit sugar and carb intake and get 30 mins of exercise daily. She verbalized understanding of results. Nat Christen, CMA

## 2020-10-05 ENCOUNTER — Other Ambulatory Visit: Payer: Self-pay

## 2020-10-05 ENCOUNTER — Telehealth: Payer: Self-pay | Admitting: Internal Medicine

## 2020-10-05 DIAGNOSIS — I10 Essential (primary) hypertension: Secondary | ICD-10-CM

## 2020-10-05 DIAGNOSIS — E785 Hyperlipidemia, unspecified: Secondary | ICD-10-CM

## 2020-10-05 MED ORDER — LISINOPRIL 40 MG PO TABS: 40.0000 mg | ORAL_TABLET | Freq: Every day | ORAL | 0 refills | Status: DC

## 2020-10-05 MED ORDER — AMLODIPINE BESYLATE 5 MG PO TABS: 5.0000 mg | ORAL_TABLET | Freq: Every day | ORAL | 0 refills | Status: DC

## 2020-10-05 MED ORDER — ATORVASTATIN CALCIUM 40 MG PO TABS: 40.0000 mg | ORAL_TABLET | Freq: Every day | ORAL | 0 refills | Status: DC

## 2020-10-05 NOTE — Telephone Encounter (Signed)
Patient called in needing refills on her medication.  Patient has her 3 month appointment coming up on 11/08/2020.    lisinopril (ZESTRIL) 40 MG tablet  atorvastatin (LIPITOR) 40 MG tablet  amLODipine (NORVASC) 5 MG tablet    Pharmacy  [id number] [address]  [place], [place] [address]  Phone:  [phone]  Fax:  [phone]  DEA #:  [id number]

## 2020-11-05 ENCOUNTER — Telehealth: Payer: Self-pay | Admitting: Internal Medicine

## 2020-11-05 NOTE — Telephone Encounter (Signed)
.  [name], sailer are scheduled for a virtual visit with your provider today.    Just as we do with appointments in the office, we must obtain your consent to participate.  Your consent will be active for this visit and any virtual visit you may have with one of our providers in the next 365 days.    If you have a MyChart account, I can also send a copy of this consent to you electronically.  All virtual visits are billed to your insurance company just like a traditional visit in the office.  As this is a virtual visit, video technology does not allow for your provider to perform a traditional examination.  This may limit your provider's ability to fully assess your condition.  If your provider identifies any concerns that need to be evaluated in person or the need to arrange testing such as labs, EKG, etc, we will make arrangements to do so.    Although advances in technology are sophisticated, we cannot ensure that it will always work on either your end or our end.  If the connection with a video visit is poor, we may have to switch to a telephone visit.  With either a video or telephone visit, we are not always able to ensure that we have a secure connection.   I need to obtain your verbal consent now.   Are you willing to proceed with your visit today?   Brenda Fleming has provided verbal consent on 11/05/2020 for a virtual visit (video or telephone).   [name] 11/05/2020  10:47 AM

## 2020-11-06 NOTE — Progress Notes (Signed)
Virtual Visit via Telephone Note  I connected with Brenda Fleming, on 11/08/2020 at 8:36 AM by telephone due to the COVID-19 pandemic and verified that I am speaking with the correct person using two identifiers.  Due to current restrictions/limitations of in-office visits due to the COVID-19 pandemic, this scheduled clinical appointment was converted to a telehealth visit.   Consent: I discussed the limitations, risks, security and privacy concerns of performing an evaluation and management service by telephone and the availability of in person appointments. I also discussed with the patient that there may be a patient responsible charge related to this service. The patient expressed understanding and agreed to proceed.   Location of Patient: Home  Location of Provider: [facility] participating in Telemedicine visit: Trinda Pascal, [place], [place]   History of Present Illness: Brenda Fleming. Brenda Fleming is a 60 year-old female who presents for hypertension, diabetes, and high cholesterol follow-up.   HYPERTENSION FOLLOW-UP: 07/28/2020 per DO note: BP is just slightly above goal. Previously much better controlled so therefore Brenda Fleming not change regimen today as patient is asymptomatic.   11/08/2020: Med Adherence: '[x]'$  Yes    '[]'$  No Medication side effects: '[]'$  Yes    '[x]'$  No Adherence with salt restriction (low-salt diet): '[]'$  Yes  '[x]'$  No Exercise: Trying  Home Monitoring?: '[]'$  Yes   '[x]'$  No Monitoring Frequency: '[]'$  Yes   '[x]'$  No Home BP results range: '[]'$  Yes    '[x]'$  No Smoking '[]'$  Yes '[x]'$  No SOB? '[]'$  Yes    '[x]'$  No Chest Pain?: '[]'$  Yes   '[x]'$  No  2. DIABETES TYPE 2 FOLLOW-UP: 07/28/2020 per DO note: Prior A1c values well controlled since May 2020. Brenda Fleming obtain on send out lab to monitor. Continue current regimen.   11/08/2020: Last A1C:  6.9% on 07/30/2020 Med Adherence:  '[x]'$  Yes  '[]'$  No Medication side effects:  '[]'$  Yes    '[x]'$  No Home  Monitoring?  '[x]'$  Yes   '[]'$  No Home glucose results range: 150's fasting  Diet Adherence: Trying  Exercise: Trying   3. HYPERLIPIDEMIA FOLLOW-UP: 07/28/2020 per DO note: - Lipid panel  11/08/2020: Med Adherence: '[x]'$  Yes    '[]'$  No Medication side effects: '[]'$  Yes    '[x]'$  No  4. SINUS CONCERNS: Began 10 days ago . May have gotten cold from granddaughters which progressed to sinus issues. Also, recently traveling for church event and feels air conditioning made worse. Using over-the-counter cough drops and cough medications. Initial mild headache. Reports nose began running on last night and felt some relief. Reports feeling 90% better since began. Declines prescribed medication at this time.     Past Medical History:  Diagnosis Date   Diabetes mellitus without complication (HCC)    Type 2   GERD (gastroesophageal reflux disease)    Goiter    Hyperlipidemia associated with type 2 diabetes mellitus ([place])    Hypertension associated with diabetes ([place])    Hypothyroidism    Obesity    Allergies  Allergen Reactions   Sulfonamide Derivatives     unknown    Current Outpatient Medications on File Prior to Visit  Medication Sig Dispense Refill   amLODipine (NORVASC) 5 MG tablet Take 1 tablet (5 mg total) by mouth daily. 90 tablet 0   atorvastatin (LIPITOR) 40 MG tablet Take 1 tablet (40 mg total) by mouth daily. 90 tablet 0   glipiZIDE (GLUCOTROL) 5 MG tablet Take 1 tablet (  5 mg total) by mouth 2 (two) times daily before a meal. Decrease to 1/2 pill before evening meal if meal is light 180 tablet 1   lisinopril (ZESTRIL) 40 MG tablet Take 1 tablet (40 mg total) by mouth daily. 90 tablet 0   Melatonin 3 MG TABS Take 1 tablet by mouth at bedtime.     metFORMIN (GLUCOPHAGE-XR) 500 MG 24 hr tablet Take 2 tablets (1,000 mg total) by mouth 2 (two) times daily with a meal. 360 tablet 1   Multiple Vitamin (MULTIVITAMIN WITH MINERALS) TABS Take 1 tablet by mouth daily. Reported on 06/03/2015     No  current facility-administered medications on file prior to visit.    Observations/Objective: Alert and oriented x 3. Not in acute distress. Physical examination not completed as this is a telemedicine visit.  Assessment and Plan: 1. Essential hypertension: - Level of blood pressure control unknown as patient does not monitor in the home setting.  - Continue Amlodipine and Lisinopril as prescribed.  - Counseled on blood pressure goal of less than 130/80, low-sodium, DASH diet, medication compliance, 150 minutes of moderate intensity exercise per week as tolerated. Discussed medication compliance, adverse effects. - BMP to evaluate kidney function and electrolyte balance. Patient prefers to have collected at [facility] and plans to have this completed soon.  - Follow-up with primary provider in 3 months or sooner if needed.  - Basic Metabolic Panel; Future - amLODipine (NORVASC) 5 MG tablet; Take 1 tablet (5 mg total) by mouth daily.  Dispense: 90 tablet; Refill: 0 - lisinopril (ZESTRIL) 40 MG tablet; Take 1 tablet (40 mg total) by mouth daily.  Dispense: 90 tablet; Refill: 0  2. Type 2 diabetes mellitus without complication, without long-term current use of insulin ([place]): - Last hemoglobin A1c at goal at 6.9% on 07/30/2020.  - Hemoglobin due today. Patient prefers to have collected at [facility] and plans to have this completed soon.  - Continue Glipizide and Metformin as prescribed.  - Continue Lisinopril for kidney protection.  - Discussed the importance of healthy eating habits, low-carbohydrate diet, low-sugar diet, regular aerobic exercise (at least 150 minutes a week as tolerated) and medication compliance to achieve or maintain control of diabetes. - Follow-up with primary provider in 3 months or sooner if needed.  - Hemoglobin A1c; Future - glipiZIDE (GLUCOTROL) 5 MG tablet; Take 1 tablet (5 mg total) by mouth 2 (two) times daily before a meal. Decrease to 1/2 pill before evening meal if meal  is light  Dispense: 180 tablet; Refill: 0 - metFORMIN (GLUCOPHAGE-XR) 500 MG 24 hr tablet; Take 2 tablets (1,000 mg total) by mouth 2 (two) times daily with a meal.  Dispense: 360 tablet; Refill: 0  3. Hyperlipidemia, unspecified hyperlipidemia type: -Practice low-fat heart healthy diet and at least 150 minutes of moderate intensity exercise weekly as tolerated.  - Continue Atorvastatin as prescribed.  - Last lipid panel obtained 07/30/2020. - Follow-up with primary provider as scheduled.  - atorvastatin (LIPITOR) 40 MG tablet; Take 1 tablet (40 mg total) by mouth daily.  Dispense: 90 tablet; Refill: 0  4. Acute non-recurrent sinusitis, unspecified location: - Patient declines pharmacological therapy.  - Follow-up with primary provider as scheduled.   Follow Up Instructions: Follow-up with primary provider in 3 months or sooner if needed for management of chronic conditions.    Patient was given clear instructions to go to Emergency Department or return to medical center if symptoms don't improve, worsen, or new problems develop.The patient verbalized  understanding.  I discussed the assessment and treatment plan with the patient. The patient was provided an opportunity to ask questions and all were answered. The patient agreed with the plan and demonstrated an understanding of the instructions.   The patient was advised to call back or seek an in-person evaluation if the symptoms worsen or if the condition fails to improve as anticipated.    I provided 15 minutes total of non-face-to-face time during this encounter.   Camillia Herter, NP  [facility] at [place], [place] 11/08/2020, 8:36 AM

## 2020-11-08 ENCOUNTER — Other Ambulatory Visit: Payer: Self-pay

## 2020-11-08 ENCOUNTER — Telehealth (INDEPENDENT_AMBULATORY_CARE_PROVIDER_SITE_OTHER): Payer: BC Managed Care – PPO | Admitting: Family

## 2020-11-08 DIAGNOSIS — E785 Hyperlipidemia, unspecified: Secondary | ICD-10-CM

## 2020-11-08 DIAGNOSIS — E119 Type 2 diabetes mellitus without complications: Secondary | ICD-10-CM | POA: Diagnosis not present

## 2020-11-08 DIAGNOSIS — I10 Essential (primary) hypertension: Secondary | ICD-10-CM | POA: Diagnosis not present

## 2020-11-08 DIAGNOSIS — J019 Acute sinusitis, unspecified: Secondary | ICD-10-CM

## 2020-11-08 MED ORDER — METFORMIN HCL ER 500 MG PO TB24: 1000.0000 mg | ORAL_TABLET | Freq: Two times a day (BID) | ORAL | 0 refills | Status: DC

## 2020-11-08 MED ORDER — AMLODIPINE BESYLATE 5 MG PO TABS: 5.0000 mg | ORAL_TABLET | Freq: Every day | ORAL | 0 refills | Status: DC

## 2020-11-08 MED ORDER — ATORVASTATIN CALCIUM 40 MG PO TABS: 40.0000 mg | ORAL_TABLET | Freq: Every day | ORAL | 0 refills | Status: DC

## 2020-11-08 MED ORDER — GLIPIZIDE 5 MG PO TABS: 5.0000 mg | ORAL_TABLET | Freq: Two times a day (BID) | ORAL | 0 refills | Status: DC

## 2020-11-08 MED ORDER — LISINOPRIL 40 MG PO TABS: 40.0000 mg | ORAL_TABLET | Freq: Every day | ORAL | 0 refills | Status: DC

## 2021-01-27 ENCOUNTER — Ambulatory Visit: Payer: BC Managed Care – PPO | Admitting: Family Medicine

## 2021-01-27 ENCOUNTER — Other Ambulatory Visit: Payer: Self-pay

## 2021-01-27 ENCOUNTER — Encounter: Payer: Self-pay | Admitting: Family Medicine

## 2021-01-27 VITALS — BP 123/83 | HR 91 | Temp 98.3°F | Resp 16 | Wt 187.0 lb

## 2021-01-27 DIAGNOSIS — E119 Type 2 diabetes mellitus without complications: Secondary | ICD-10-CM | POA: Diagnosis not present

## 2021-01-27 DIAGNOSIS — E785 Hyperlipidemia, unspecified: Secondary | ICD-10-CM

## 2021-01-27 DIAGNOSIS — I10 Essential (primary) hypertension: Secondary | ICD-10-CM

## 2021-01-27 LAB — POCT GLYCOSYLATED HEMOGLOBIN (HGB A1C): Hemoglobin A1C: 6.5 % — AB (ref 4.0–5.6)

## 2021-01-27 MED ORDER — METFORMIN HCL ER 500 MG PO TB24: 1000.0000 mg | ORAL_TABLET | Freq: Two times a day (BID) | ORAL | 1 refills | Status: DC

## 2021-01-27 MED ORDER — AMLODIPINE BESYLATE 5 MG PO TABS: 5.0000 mg | ORAL_TABLET | Freq: Every day | ORAL | 1 refills | Status: DC

## 2021-01-27 MED ORDER — GLIPIZIDE 5 MG PO TABS: 5.0000 mg | ORAL_TABLET | Freq: Two times a day (BID) | ORAL | 1 refills | Status: DC

## 2021-01-27 MED ORDER — ATORVASTATIN CALCIUM 40 MG PO TABS: 40.0000 mg | ORAL_TABLET | Freq: Every day | ORAL | 1 refills | Status: DC

## 2021-01-27 NOTE — Progress Notes (Signed)
Established  Patient Office Visit  Subjective:  Patient ID: Brenda Fleming, female    DOB: Jun 14, 1960  Age: 60 y.o. MRN: [id number]  CC:  Chief Complaint  Patient presents with   Follow-up   Hypertension    HPI Brenda Fleming presents for routine follow up of chronic med issues with med refills. Patient denies acute complaints or concerns.   Past Medical History:  Diagnosis Date   Diabetes mellitus without complication (HCC)    Type 2   GERD (gastroesophageal reflux disease)    Goiter    Hyperlipidemia associated with type 2 diabetes mellitus ([place])    Hypertension associated with diabetes ([place])    Hypothyroidism    Obesity     Past Surgical History:  Procedure Laterality Date   ABDOMINAL HYSTERECTOMY     RHINOPLASTY     TUBAL LIGATION      Family History  Problem Relation Age of Onset   Hypertension Mother    Anemia Mother    Heart disease Father    Diabetes Brother    Heart disease Brother    Diabetes Brother    Diabetes Brother    Breast cancer Paternal Aunt    Colon cancer Neg Hx    Stroke Neg Hx    Cancer Neg Hx     Social History   Socioeconomic History   Marital status: Married    Spouse name: Not on file   Number of children: Not on file   Years of education: Not on file   Highest education level: Not on file  Occupational History   Not on file  Tobacco Use   Smoking status: Never   Smokeless tobacco: Never  Substance and Sexual Activity   Alcohol use: Yes    Alcohol/week: 0.0 standard drinks    Comment: very occasionally   Drug use: No   Sexual activity: Yes    Birth control/protection: Surgical  Other Topics Concern   Not on file  Social History Narrative   Not on file   Social Determinants of Health   Financial Resource Strain: Not on file  Food Insecurity: Not on file  Transportation Needs: Not on file  Physical Activity: Not on file  Stress: Not on file  Social Connections: Not on file  Intimate Partner Violence: Not  on file    ROS Review of Systems  All other systems reviewed and are negative.  Objective:   Today's Vitals: BP 123/83   Pulse 91   Temp 98.3 F (36.8 C) (Oral)   Resp 16   Wt 187 lb (84.8 kg)   SpO2 98%   BMI 32.10 kg/m   Physical Exam Vitals and nursing note reviewed.  Constitutional:      General: She is not in acute distress. Cardiovascular:     Rate and Rhythm: Normal rate and regular rhythm.  Pulmonary:     Effort: Pulmonary effort is normal.     Breath sounds: Normal breath sounds.  Abdominal:     Palpations: Abdomen is soft.     Tenderness: There is no abdominal tenderness.  Musculoskeletal:     Right lower leg: No edema.     Left lower leg: No edema.  Neurological:     General: No focal deficit present.     Mental Status: She is alert and oriented to person, place, and time.    Assessment & Plan:   1. Type 2 diabetes mellitus without complication, without long-term current use of insulin ([place])  HgbA1c is at goal. Continue present management and monitor - POCT glycosylated hemoglobin (Hb A1C)  2. Hyperlipidemia, unspecified hyperlipidemia type Continue present management  3. Essential hypertension Continue present management    Outpatient Encounter Medications as of 01/27/2021  Medication Sig   Melatonin 3 MG TABS Take 1 tablet by mouth at bedtime.   Multiple Vitamin (MULTIVITAMIN WITH MINERALS) TABS Take 1 tablet by mouth daily. Reported on 06/03/2015   [DISCONTINUED] amLODipine (NORVASC) 5 MG tablet Take 1 tablet (5 mg total) by mouth daily.   [DISCONTINUED] atorvastatin (LIPITOR) 40 MG tablet Take 1 tablet (40 mg total) by mouth daily.   [DISCONTINUED] glipiZIDE (GLUCOTROL) 5 MG tablet Take 1 tablet (5 mg total) by mouth 2 (two) times daily before a meal. Decrease to 1/2 pill before evening meal if meal is light   [DISCONTINUED] metFORMIN (GLUCOPHAGE-XR) 500 MG 24 hr tablet Take 2 tablets (1,000 mg total) by mouth 2 (two) times daily with a meal.    amLODipine (NORVASC) 5 MG tablet Take 1 tablet (5 mg total) by mouth daily.   atorvastatin (LIPITOR) 40 MG tablet Take 1 tablet (40 mg total) by mouth daily.   glipiZIDE (GLUCOTROL) 5 MG tablet Take 1 tablet (5 mg total) by mouth 2 (two) times daily before a meal. Decrease to 1/2 pill before evening meal if meal is light   metFORMIN (GLUCOPHAGE-XR) 500 MG 24 hr tablet Take 2 tablets (1,000 mg total) by mouth 2 (two) times daily with a meal.   [DISCONTINUED] lisinopril (ZESTRIL) 40 MG tablet Take 1 tablet (40 mg total) by mouth daily.   No facility-administered encounter medications on file as of 01/27/2021.    Follow-up: Return in about 6 months (around 07/27/2021) for follow up, chronic med issues.   Becky Sax, MD

## 2021-02-03 ENCOUNTER — Other Ambulatory Visit: Payer: Self-pay | Admitting: Family Medicine

## 2021-02-03 DIAGNOSIS — Z139 Encounter for screening, unspecified: Secondary | ICD-10-CM

## 2021-02-05 ENCOUNTER — Other Ambulatory Visit: Payer: Self-pay

## 2021-02-05 ENCOUNTER — Ambulatory Visit
Admission: RE | Admit: 2021-02-05 | Discharge: 2021-02-05 | Disposition: A | Discharge status: Home / Self Care | Payer: BC Managed Care – PPO | Source: Ambulatory Visit | Attending: Family Medicine | Admitting: Family Medicine

## 2021-02-05 DIAGNOSIS — Z139 Encounter for screening, unspecified: Secondary | ICD-10-CM

## 2021-03-28 ENCOUNTER — Other Ambulatory Visit: Payer: Self-pay | Admitting: Family

## 2021-03-28 DIAGNOSIS — I10 Essential (primary) hypertension: Secondary | ICD-10-CM

## 2021-03-30 ENCOUNTER — Other Ambulatory Visit: Payer: Self-pay | Admitting: *Deleted

## 2021-03-30 DIAGNOSIS — E785 Hyperlipidemia, unspecified: Secondary | ICD-10-CM

## 2021-03-30 DIAGNOSIS — I10 Essential (primary) hypertension: Secondary | ICD-10-CM

## 2021-03-30 MED ORDER — AMLODIPINE BESYLATE 5 MG PO TABS: 5.0000 mg | ORAL_TABLET | Freq: Every day | ORAL | 1 refills | Status: DC

## 2021-03-30 MED ORDER — ATORVASTATIN CALCIUM 40 MG PO TABS: 40.0000 mg | ORAL_TABLET | Freq: Every day | ORAL | 1 refills | Status: DC

## 2021-03-30 MED ORDER — LISINOPRIL 40 MG PO TABS: 40.0000 mg | ORAL_TABLET | Freq: Every day | ORAL | 0 refills | Status: DC

## 2021-03-30 NOTE — Telephone Encounter (Signed)
Patient was called and we went through here medication for clarification. Patient is currently taking  amLODipine (NORVASC) 5 MG tablet 5 mg, Daily 1 ordered        atorvastatin (LIPITOR) 40 MG tablet 40 mg, Daily 1 ordered       glipiZIDE (GLUCOTROL) 5 MG tablet 5 mg, 2 times daily before meals 1 ordered       lisinopril (ZESTRIL) 40 MG tablet 40 mg, Daily 0 ordered       Melatonin 3 MG TABS 1 tablet, Daily at bedtime        metFORMIN (GLUCOPHAGE

## 2021-07-27 ENCOUNTER — Ambulatory Visit: Payer: BC Managed Care – PPO | Admitting: Family Medicine

## 2021-07-27 ENCOUNTER — Encounter: Payer: Self-pay | Admitting: Family Medicine

## 2021-07-27 VITALS — BP 134/86 | HR 82 | Temp 98.0°F | Resp 16 | Ht 65.0 in | Wt 187.0 lb

## 2021-07-27 DIAGNOSIS — E119 Type 2 diabetes mellitus without complications: Secondary | ICD-10-CM | POA: Diagnosis not present

## 2021-07-27 DIAGNOSIS — E010 Iodine-deficiency related diffuse (endemic) goiter: Secondary | ICD-10-CM

## 2021-07-27 DIAGNOSIS — I10 Essential (primary) hypertension: Secondary | ICD-10-CM

## 2021-07-27 LAB — POCT GLYCOSYLATED HEMOGLOBIN (HGB A1C): Hemoglobin A1C: 6.8 % — AB (ref 4.0–5.6)

## 2021-07-27 MED ORDER — LISINOPRIL 40 MG PO TABS: 40.0000 mg | ORAL_TABLET | Freq: Every day | ORAL | 0 refills | Status: DC

## 2021-07-27 NOTE — Progress Notes (Signed)
? ?Established Patient Office Visit ? ?Subjective   ? ?Patient ID: Brenda Fleming, female    DOB: [birth year]  Age: 61 y.o. MRN: [id number] ? ?CC:  ?Chief Complaint  ?Patient presents with  ? Follow-up  ? Hypertension  ? Diabetes  ? ? ?HPI ?Brenda Fleming presents for routine follow up of chronic med issues. Patient denies acute complaints or concerns.  ? ? ?Outpatient Encounter Medications as of 07/27/2021  ?Medication Sig  ? amLODipine (NORVASC) 5 MG tablet Take 1 tablet (5 mg total) by mouth daily.  ? atorvastatin (LIPITOR) 40 MG tablet Take 1 tablet (40 mg total) by mouth daily.  ? Melatonin 3 MG TABS Take 1 tablet by mouth at bedtime.  ? Multiple Vitamin (MULTIVITAMIN WITH MINERALS) TABS Take 1 tablet by mouth daily. Reported on 06/03/2015  ? [DISCONTINUED] lisinopril (ZESTRIL) 40 MG tablet Take 1 tablet (40 mg total) by mouth daily.  ? glipiZIDE (GLUCOTROL) 5 MG tablet Take 1 tablet (5 mg total) by mouth 2 (two) times daily before a meal. Decrease to 1/2 pill before evening meal if meal is light  ? lisinopril (ZESTRIL) 40 MG tablet Take 1 tablet (40 mg total) by mouth daily.  ? metFORMIN (GLUCOPHAGE-XR) 500 MG 24 hr tablet Take 2 tablets (1,000 mg total) by mouth 2 (two) times daily with a meal.  ? ?No facility-administered encounter medications on file as of 07/27/2021.  ? ? ?Past Medical History:  ?Diagnosis Date  ? Diabetes mellitus without complication ([place])   ? Type 2  ? GERD (gastroesophageal reflux disease)   ? Goiter   ? Hyperlipidemia associated with type 2 diabetes mellitus ([place])   ? Hypertension associated with diabetes ([place])   ? Hypothyroidism   ? Obesity   ? ? ?Past Surgical History:  ?Procedure Laterality Date  ? ABDOMINAL HYSTERECTOMY    ? RHINOPLASTY    ? TUBAL LIGATION    ? ? ?Family History  ?Problem Relation Age of Onset  ? Hypertension Mother   ? Anemia Mother   ? Heart disease Father   ? Diabetes Brother   ? Heart disease Brother   ? Diabetes Brother   ? Diabetes Brother   ? Breast cancer  Paternal Aunt   ? Colon cancer Neg Hx   ? Stroke Neg Hx   ? Cancer Neg Hx   ? ? ?Social History  ? ?Socioeconomic History  ? Marital status: Married  ?  Spouse name: Not on file  ? Number of children: Not on file  ? Years of education: Not on file  ? Highest education level: Not on file  ?Occupational History  ? Not on file  ?Tobacco Use  ? Smoking status: Never  ? Smokeless tobacco: Never  ?Substance and Sexual Activity  ? Alcohol use: Yes  ?  Alcohol/week: 0.0 standard drinks  ?  Comment: very occasionally  ? Drug use: No  ? Sexual activity: Yes  ?  Birth control/protection: Surgical  ?Other Topics Concern  ? Not on file  ?Social History Narrative  ? Not on file  ? ?Social Determinants of Health  ? ?Financial Resource Strain: Not on file  ?Food Insecurity: Not on file  ?Transportation Needs: Not on file  ?Physical Activity: Not on file  ?Stress: Not on file  ?Social Connections: Not on file  ?Intimate Partner Violence: Not on file  ? ? ?Review of Systems  ?All other systems reviewed and are negative. ? ?  ? ? ?Objective   ? ?  BP 134/86   Pulse 82   Temp 98 ?F (36.7 ?C) (Oral)   Resp 16   Ht '5\' 5"'$  (1.651 m)   Wt 187 lb (84.8 kg)   SpO2 95%   BMI 31.12 kg/m?  ? ?Physical Exam ?Vitals and nursing note reviewed.  ?Constitutional:   ?   General: She is not in acute distress. ?Neck:  ?   Thyroid: Thyromegaly present. No thyroid tenderness.  ?Cardiovascular:  ?   Rate and Rhythm: Normal rate and regular rhythm.  ?Pulmonary:  ?   Effort: Pulmonary effort is normal.  ?   Breath sounds: Normal breath sounds.  ?Abdominal:  ?   Palpations: Abdomen is soft.  ?   Tenderness: There is no abdominal tenderness.  ?Musculoskeletal:  ?   Cervical back: Normal range of motion and neck supple.  ?   Right lower leg: No edema.  ?   Left lower leg: No edema.  ?Neurological:  ?   General: No focal deficit present.  ?   Mental Status: She is alert and oriented to person, place, and time.  ? ? ? ?  ? ?Assessment & Plan:  ? ?.1.  Essential hypertension ?Appears stable with present management. Continue and monitor. Meds refilled ?- lisinopril (ZESTRIL) 40 MG tablet; Take 1 tablet (40 mg total) by mouth daily.  Dispense: 90 tablet; Refill: 0 ? ?2. Type 2 diabetes mellitus without complication, without long-term current use of insulin ([place]) ?Slightly increased A1c but still at goal. Continue and monitor ?- POCT glycosylated hemoglobin (Hb A1C) ? ?3. Thyromegaly ?Patient to be referred to ENT for further eval/mgt. Patient to obtain info of specific specialist that she desres to be referred to ? ? ?No follow-ups on file.  ? ?Becky Sax, MD ? ? ?

## 2021-07-27 NOTE — Progress Notes (Signed)
6.8Patient is here for 6 month f/u HTN, DMII. ?Patient has no new concerns for provider today ?

## 2021-10-25 ENCOUNTER — Other Ambulatory Visit: Payer: Self-pay | Admitting: Family Medicine

## 2021-10-25 DIAGNOSIS — I10 Essential (primary) hypertension: Secondary | ICD-10-CM

## 2021-10-25 NOTE — Telephone Encounter (Signed)
Order complete. 

## 2021-12-07 IMAGING — MG MM DIGITAL SCREENING BILAT W/ TOMO AND CAD
8 series · 8 of 24 positions shown · non-contrast
Comparison: Previous exam(s).

CLINICAL DATA: Screening.

EXAM:
DIGITAL SCREENING BILATERAL MAMMOGRAM WITH TOMOSYNTHESIS AND CAD
TECHNIQUE: Bilateral screening digital craniocaudal and mediolateral oblique
mammograms were obtained. Bilateral screening digital breast
tomosynthesis was performed. The images were evaluated with
computer-aided detection.

[R MLO synth-2D]
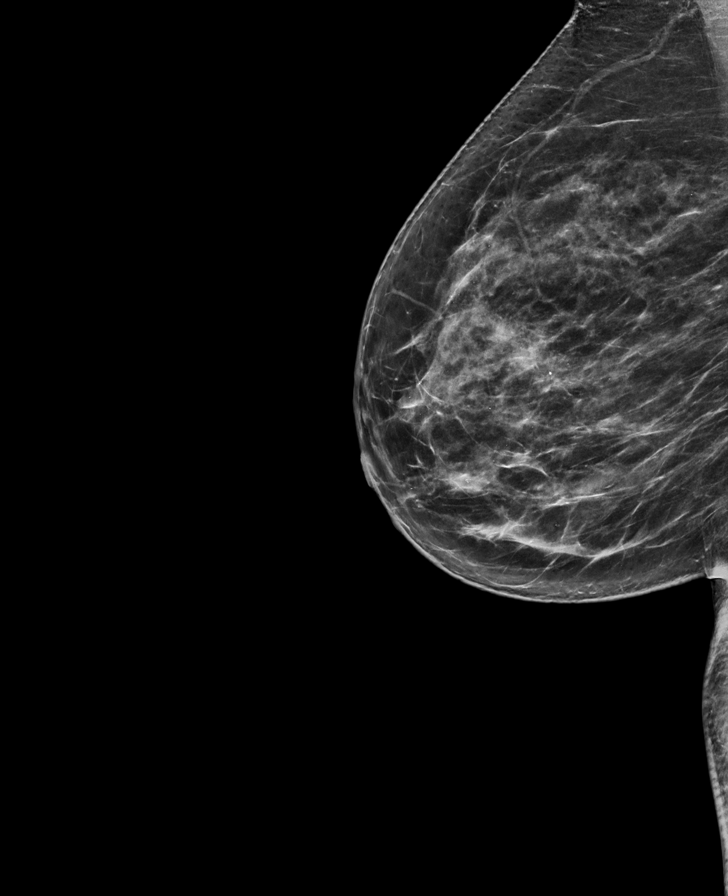

[L CC synth-2D]
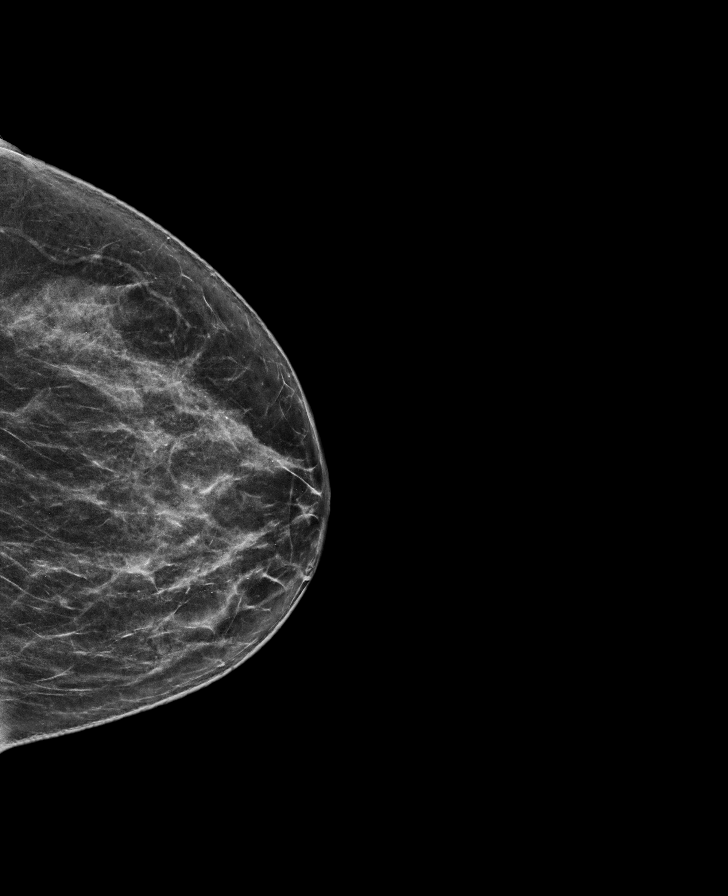

[R CC synth-2D]
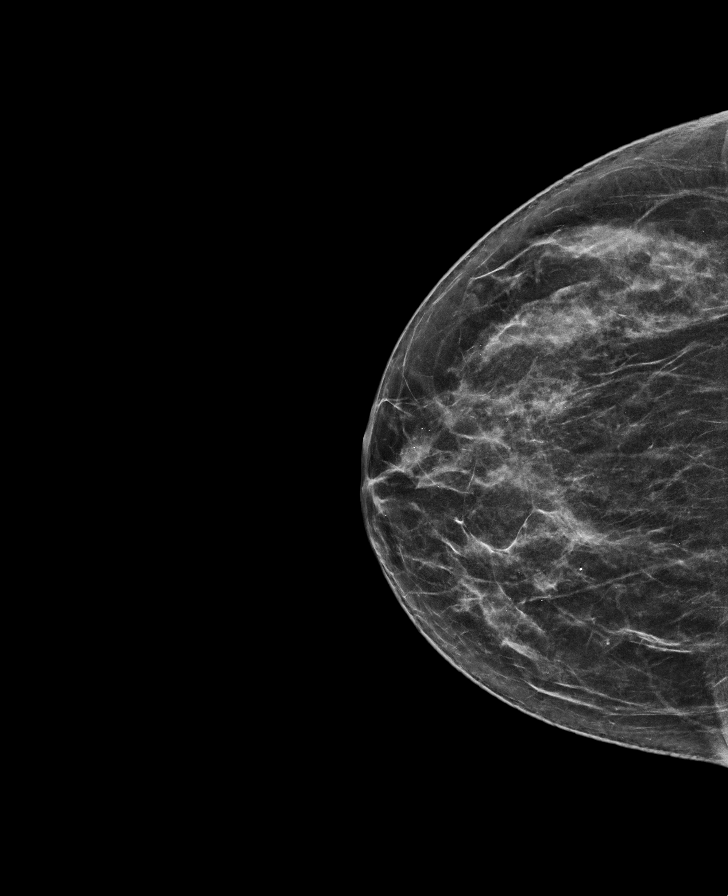

[L MLO synth-2D]
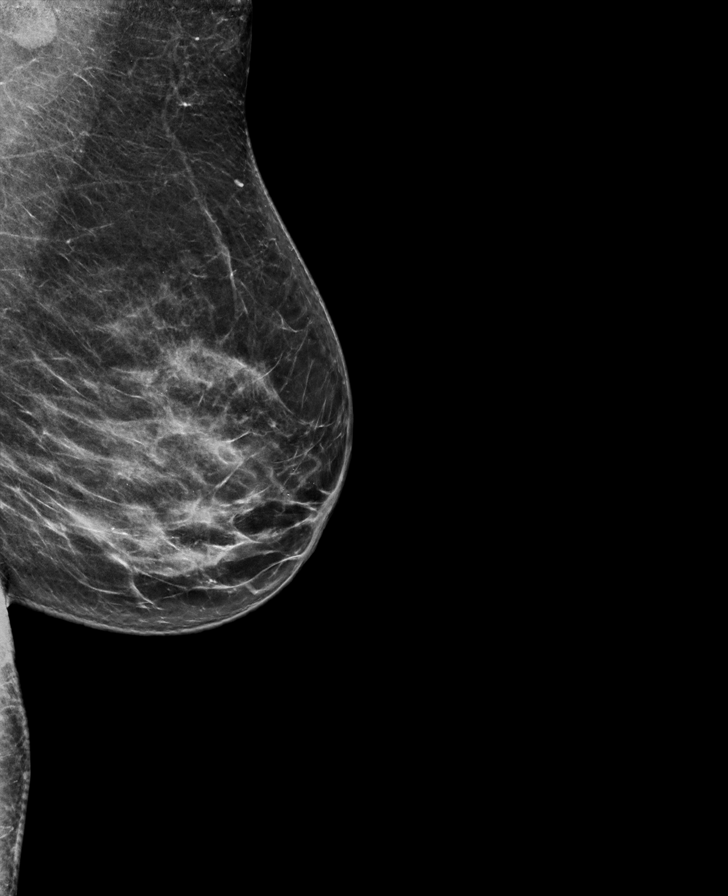

[R MLO tomo · tomo slice 37/72.0]
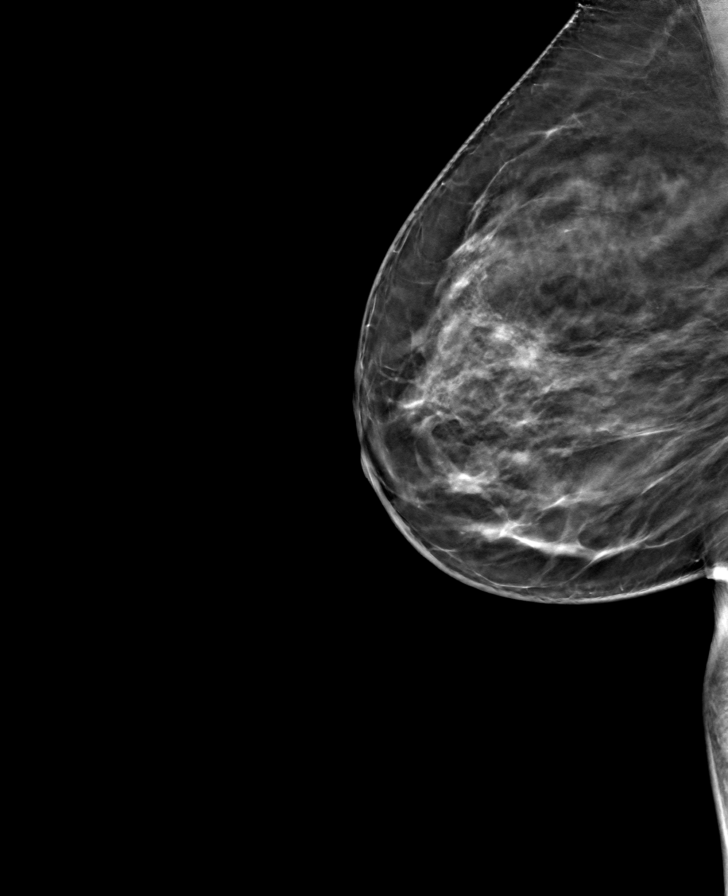

[L MLO tomo · tomo slice 35/70.0]
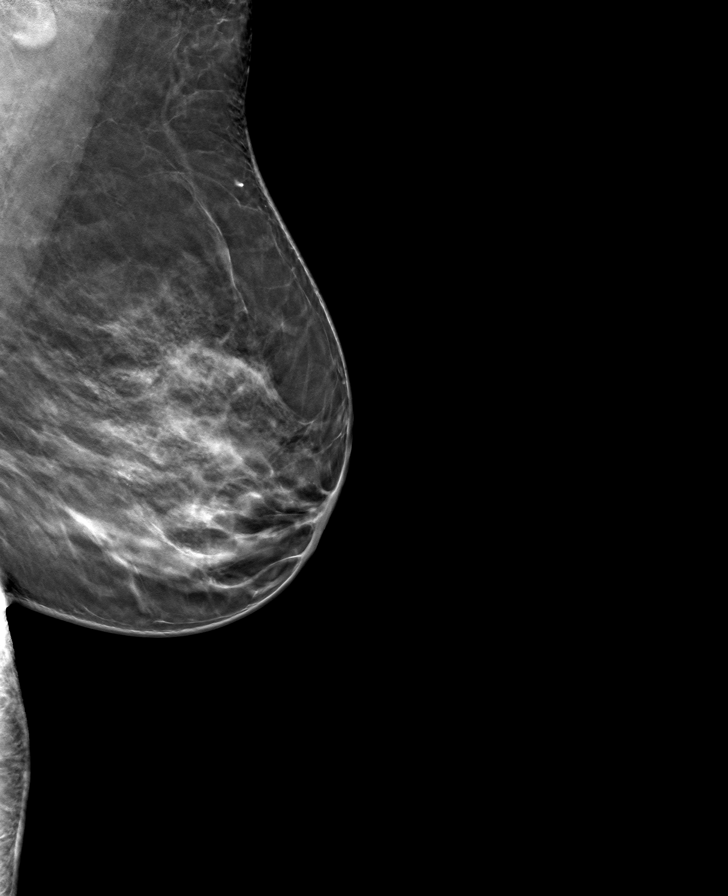

[R CC tomo · tomo slice 33/66.0]
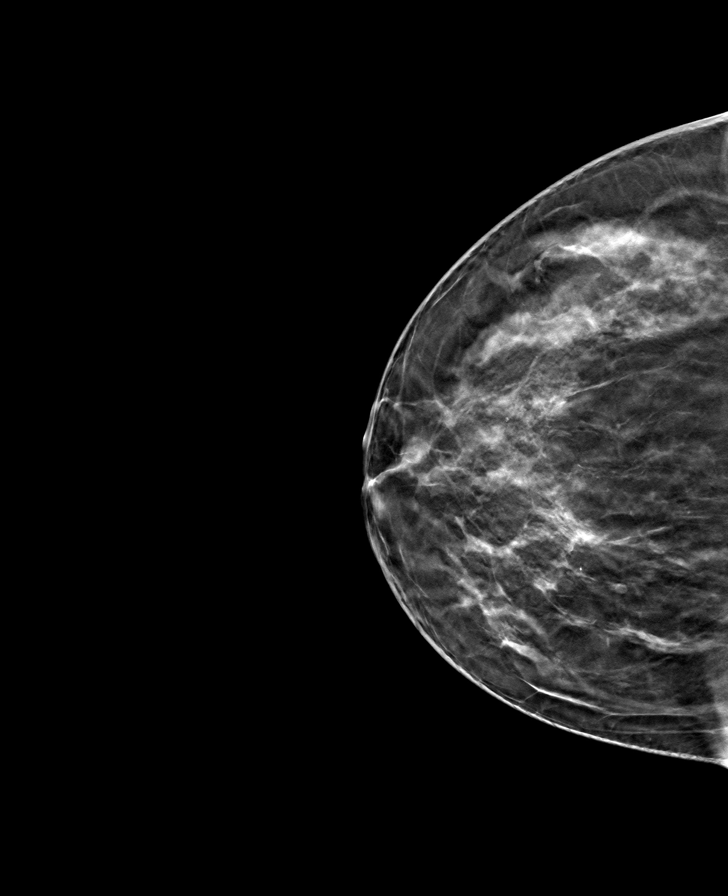

[L CC tomo · tomo slice 34/67.0]
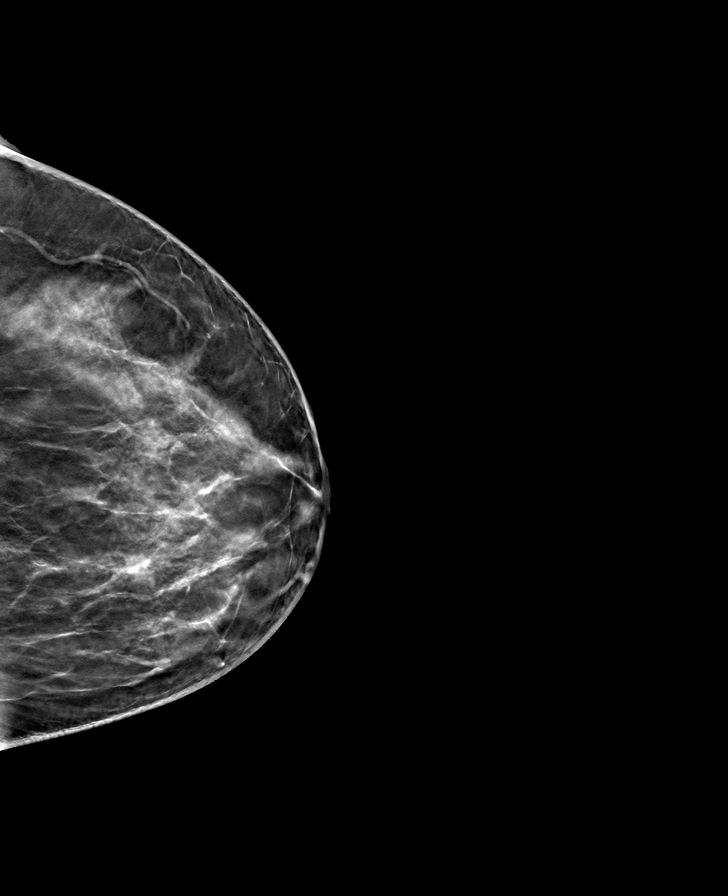

[8 of 24 positions shown; findings below may reference images not displayed]

ACR Breast Density Category c: The breast tissue is heterogeneously
dense, which may obscure small masses.
FINDINGS: There are no findings suspicious for malignancy.
IMPRESSION: No mammographic evidence of malignancy. A result letter of this
screening mammogram will be mailed directly to the patient.

RECOMMENDATION:
Screening mammogram in one year. (Code:Q3-W-BC3)

BI-RADS CATEGORY  1: Negative.

## 2021-12-24 ENCOUNTER — Other Ambulatory Visit: Payer: Self-pay | Admitting: Family Medicine

## 2021-12-24 DIAGNOSIS — I10 Essential (primary) hypertension: Secondary | ICD-10-CM

## 2021-12-26 NOTE — Telephone Encounter (Signed)
Requested Prescriptions  Pending Prescriptions Disp Refills  . amLODipine (NORVASC) 5 MG tablet [Pharmacy Med Name: AMLODIPINE BESYLATE 5 MG TAB] 90 tablet 0    Sig: TAKE 1 TABLET (5 MG TOTAL) BY MOUTH DAILY.     Cardiovascular: Calcium Channel Blockers 2 Passed - 12/24/2021  1:19 PM      Passed - Last BP in normal range    BP Readings from Last 1 Encounters:  07/27/21 134/86         Passed - Last Heart Rate in normal range    Pulse Readings from Last 1 Encounters:  07/27/21 82         Passed - Valid encounter within last 6 months    Recent Outpatient Visits          5 months ago Type 2 diabetes mellitus without complication, without long-term current use of insulin ([place])   [facility] at [facility], MD   11 months ago Type 2 diabetes mellitus without complication, without long-term current use of insulin [facility])   Primary Care at [facility], MD   1 year ago Essential hypertension   Primary Care at [facility], Amy J, NP   1 year ago Type 2 diabetes mellitus without complication, without long-term current use of insulin [facility])   Primary Care at [facility], Bayard Beaver, MD   1 year ago Type 2 diabetes mellitus without complication, without long-term current use of insulin [facility])   Primary Care at [facility], Bayard Beaver, MD      Future Appointments            In 1 month Dorna Mai, MD Primary Care at [facility]

## 2021-12-28 ENCOUNTER — Other Ambulatory Visit: Payer: Self-pay | Admitting: Family Medicine

## 2021-12-28 DIAGNOSIS — E119 Type 2 diabetes mellitus without complications: Secondary | ICD-10-CM

## 2022-01-10 ENCOUNTER — Other Ambulatory Visit: Payer: Self-pay | Admitting: Family Medicine

## 2022-01-10 DIAGNOSIS — E119 Type 2 diabetes mellitus without complications: Secondary | ICD-10-CM

## 2022-01-19 ENCOUNTER — Other Ambulatory Visit: Payer: Self-pay | Admitting: Family

## 2022-01-19 DIAGNOSIS — I10 Essential (primary) hypertension: Secondary | ICD-10-CM

## 2022-01-26 ENCOUNTER — Encounter: Payer: Self-pay | Admitting: Family Medicine

## 2022-01-26 ENCOUNTER — Ambulatory Visit: Payer: BC Managed Care – PPO | Admitting: Family Medicine

## 2022-01-26 VITALS — BP 119/80 | HR 105 | Temp 98.1°F | Resp 16 | Wt 177.0 lb

## 2022-01-26 DIAGNOSIS — E119 Type 2 diabetes mellitus without complications: Secondary | ICD-10-CM

## 2022-01-26 DIAGNOSIS — I10 Essential (primary) hypertension: Secondary | ICD-10-CM | POA: Diagnosis not present

## 2022-01-26 DIAGNOSIS — E785 Hyperlipidemia, unspecified: Secondary | ICD-10-CM

## 2022-01-26 DIAGNOSIS — Z23 Encounter for immunization: Secondary | ICD-10-CM | POA: Diagnosis not present

## 2022-01-26 LAB — POCT GLYCOSYLATED HEMOGLOBIN (HGB A1C): Hemoglobin A1C: 7 % — AB (ref 4.0–5.6)

## 2022-01-26 NOTE — Progress Notes (Signed)
Established Patient Office Visit  Subjective    Patient ID: Brenda Fleming, female    DOB: 1960-06-19  Age: 61 y.o. MRN: [id number]  CC:  Chief Complaint  Patient presents with   Follow-up   Diabetes    HPI Brenda Fleming presents for routine follow up of chronic med issues. Patient denies acute complaints or concerns.    Outpatient Encounter Medications as of 01/26/2022  Medication Sig   amLODipine (NORVASC) 5 MG tablet TAKE 1 TABLET (5 MG TOTAL) BY MOUTH DAILY.   atorvastatin (LIPITOR) 40 MG tablet Take 1 tablet (40 mg total) by mouth daily.   glipiZIDE (GLUCOTROL) 5 MG tablet TAKE 1 TABLET 2 TIMES DAILY BEFORE A MEAL. DECREASE TO 1/2 BEFORE EVENING MEAL IF MEAL IS LIGHT   lisinopril (ZESTRIL) 40 MG tablet TAKE 1 TABLET BY MOUTH EVERY DAY   Melatonin 3 MG TABS Take 1 tablet by mouth at bedtime.   metFORMIN (GLUCOPHAGE-XR) 500 MG 24 hr tablet Take 2 tablets (1,000 mg total) by mouth 2 (two) times daily with a meal.   Multiple Vitamin (MULTIVITAMIN WITH MINERALS) TABS Take 1 tablet by mouth daily. Reported on 06/03/2015   No facility-administered encounter medications on file as of 01/26/2022.    Past Medical History:  Diagnosis Date   Diabetes mellitus without complication (HCC)    Type 2   GERD (gastroesophageal reflux disease)    Goiter    Hyperlipidemia associated with type 2 diabetes mellitus ([place])    Hypertension associated with diabetes ([place])    Hypothyroidism    Obesity     Past Surgical History:  Procedure Laterality Date   ABDOMINAL HYSTERECTOMY     RHINOPLASTY     TUBAL LIGATION      Family History  Problem Relation Age of Onset   Hypertension Mother    Anemia Mother    Heart disease Father    Diabetes Brother    Heart disease Brother    Diabetes Brother    Diabetes Brother    Breast cancer Paternal Aunt    Colon cancer Neg Hx    Stroke Neg Hx    Cancer Neg Hx     Social History   Socioeconomic History   Marital status: Married    Spouse  name: Not on file   Number of children: Not on file   Years of education: Not on file   Highest education level: Not on file  Occupational History   Not on file  Tobacco Use   Smoking status: Never   Smokeless tobacco: Never  Substance and Sexual Activity   Alcohol use: Yes    Alcohol/week: 0.0 standard drinks of alcohol    Comment: very occasionally   Drug use: No   Sexual activity: Yes    Birth control/protection: Surgical  Other Topics Concern   Not on file  Social History Narrative   Not on file   Social Determinants of Health   Financial Resource Strain: Not on file  Food Insecurity: Not on file  Transportation Needs: Not on file  Physical Activity: Not on file  Stress: Not on file  Social Connections: Not on file  Intimate Partner Violence: Not on file    Review of Systems  All other systems reviewed and are negative.       Objective    BP 119/80   Pulse (!) 105   Temp 98.1 F (36.7 C) (Oral)   Resp 16   Wt 177 lb (80.3 kg)  SpO2 96%   BMI 29.45 kg/m   Physical Exam Vitals and nursing note reviewed.  Constitutional:      General: She is not in acute distress. Cardiovascular:     Rate and Rhythm: Normal rate and regular rhythm.  Pulmonary:     Effort: Pulmonary effort is normal.     Breath sounds: Normal breath sounds.  Abdominal:     Palpations: Abdomen is soft.     Tenderness: There is no abdominal tenderness.  Neurological:     General: No focal deficit present.     Mental Status: She is alert and oriented to person, place, and time.         Assessment & Plan:   1. Type 2 diabetes mellitus without complication, without long-term current use of insulin (HCC) Slightly increased A1c and just at goal. Continue and monitor - POCT glycosylated hemoglobin (Hb A1C)  2. Essential hypertension Appears stable. Continue   3. Hyperlipidemia, unspecified hyperlipidemia type continue  4. Need for vaccination  - Flu Vaccine QUAD 44moIM  (Fluarix, Fluzone & Alfiuria Quad PF)    Return in about 3 months (around 04/28/2022) for follow up.   WBecky Sax MD

## 2022-01-28 ENCOUNTER — Other Ambulatory Visit: Payer: Self-pay | Admitting: Family Medicine

## 2022-01-28 DIAGNOSIS — I10 Essential (primary) hypertension: Secondary | ICD-10-CM

## 2022-01-30 NOTE — Telephone Encounter (Signed)
Refilled 12/26/2021 #90 0 rf. Requested Prescriptions  Pending Prescriptions Disp Refills   amLODipine (NORVASC) 5 MG tablet [Pharmacy Med Name: AMLODIPINE BESYLATE 5 MG TAB] 90 tablet 0    Sig: TAKE 1 TABLET (5 MG TOTAL) BY MOUTH DAILY.     Cardiovascular: Calcium Channel Blockers 2 Passed - 01/28/2022 10:16 AM      Passed - Last BP in normal range    BP Readings from Last 1 Encounters:  01/26/22 119/80         Passed - Last Heart Rate in normal range    Pulse Readings from Last 1 Encounters:  01/26/22 (!) 105         Passed - Valid encounter within last 6 months    Recent Outpatient Visits           4 days ago Type 2 diabetes mellitus without complication, without long-term current use of insulin ([place])   Primary Care at [facility], Clyde Canterbury, MD   6 months ago Type 2 diabetes mellitus without complication, without long-term current use of insulin [facility])   Primary Care at [facility], MD   1 year ago Type 2 diabetes mellitus without complication, without long-term current use of insulin [facility])   Primary Care at [facility], MD   1 year ago Essential hypertension   [facility] Care at [facility], Amy J, NP   1 year ago Type 2 diabetes mellitus without complication, without long-term current use of insulin [facility])   Primary Care at [facility], Bayard Beaver, MD       Future Appointments             In 2 months Dorna Mai, MD Primary Care at [facility]

## 2022-02-26 ENCOUNTER — Other Ambulatory Visit: Payer: Self-pay | Admitting: Family Medicine

## 2022-02-26 DIAGNOSIS — I10 Essential (primary) hypertension: Secondary | ICD-10-CM

## 2022-03-21 ENCOUNTER — Other Ambulatory Visit: Payer: Self-pay | Admitting: Family Medicine

## 2022-03-21 DIAGNOSIS — E785 Hyperlipidemia, unspecified: Secondary | ICD-10-CM

## 2022-03-21 NOTE — Telephone Encounter (Signed)
Medication Refill - Medication: atorvastatin (LIPITOR) 40 MG tablet   Has the patient contacted their pharmacy? No. (Agent: If no, request that the patient contact the pharmacy for the refill. If patient does not wish to contact the pharmacy document the reason why and proceed with request.) (Agent: If yes, when and what did the pharmacy advise?)  Preferred Pharmacy (with phone number or street name):  [id number]: [phone] Fax: [phone]    Has the patient been seen for an appointment in the last year OR does the patient have an upcoming appointment? Yes.    Agent: Please be advised that RX refills may take up to 3 business days. We ask that you follow-up with your pharmacy.

## 2022-03-21 NOTE — Telephone Encounter (Signed)
Order complete. 

## 2022-03-23 NOTE — Telephone Encounter (Signed)
Requested medication (s) are due for refill today:   Yes  Requested medication (s) are on the active medication list:   Yes  Future visit scheduled:   Yes   Last ordered: 03/21/2022 #30, 2 refills  Returned because pharmacy requesting a DX Code   Requested Prescriptions  Pending Prescriptions Disp Refills   atorvastatin (LIPITOR) 40 MG tablet 90 tablet 1    Sig: Take 1 tablet (40 mg total) by mouth daily.     Cardiovascular:  Antilipid - Statins Failed - 03/21/2022  5:05 PM      Failed - Lipid Panel in normal range within the last 12 months    Cholesterol, Total  Date Value Ref Range Status  07/30/2020 146 100 - 199 mg/dL Final   LDL Chol Calc (NIH)  Date Value Ref Range Status  07/30/2020 84 0 - 99 mg/dL Final   HDL  Date Value Ref Range Status  07/30/2020 43 >39 mg/dL Final   Triglycerides  Date Value Ref Range Status  07/30/2020 102 0 - 149 mg/dL Final         Passed - Patient is not pregnant      Passed - Valid encounter within last 12 months    Recent Outpatient Visits           1 month ago Type 2 diabetes mellitus without complication, without long-term current use of insulin ([place])   Primary Care at [facility], Clyde Canterbury, MD   7 months ago Type 2 diabetes mellitus without complication, without long-term current use of insulin [facility])   Primary Care at [facility], Clyde Canterbury, MD   1 year ago Type 2 diabetes mellitus without complication, without long-term current use of insulin [facility])   Primary Care at [facility], MD   1 year ago Essential hypertension   Primary Care at [facility], Amy J, NP   1 year ago Type 2 diabetes mellitus without complication, without long-term current use of insulin [facility])   Primary Care at [facility], Bayard Beaver, MD       Future Appointments             In 1 month Dorna Mai, MD Primary Care at [facility]

## 2022-04-20 ENCOUNTER — Other Ambulatory Visit: Payer: Self-pay | Admitting: Family

## 2022-04-20 DIAGNOSIS — I10 Essential (primary) hypertension: Secondary | ICD-10-CM

## 2022-04-26 ENCOUNTER — Ambulatory Visit: Payer: BC Managed Care – PPO | Admitting: Family Medicine

## 2022-04-26 VITALS — BP 131/80 | HR 90 | Temp 98.1°F | Resp 16 | Wt 188.0 lb

## 2022-04-26 DIAGNOSIS — E119 Type 2 diabetes mellitus without complications: Secondary | ICD-10-CM | POA: Diagnosis not present

## 2022-04-26 DIAGNOSIS — E785 Hyperlipidemia, unspecified: Secondary | ICD-10-CM | POA: Diagnosis not present

## 2022-04-26 DIAGNOSIS — I10 Essential (primary) hypertension: Secondary | ICD-10-CM | POA: Diagnosis not present

## 2022-04-26 DIAGNOSIS — E010 Iodine-deficiency related diffuse (endemic) goiter: Secondary | ICD-10-CM | POA: Diagnosis not present

## 2022-04-26 NOTE — Progress Notes (Unsigned)
Patient is here for their 3 month follow-up Patient has no concerns today Care gaps have been discussed with patient  

## 2022-04-27 ENCOUNTER — Encounter: Payer: Self-pay | Admitting: Family Medicine

## 2022-04-27 LAB — LIPID PANEL
Chol/HDL Ratio: 3 ratio (ref 0.0–4.4)
Cholesterol, Total: 144 mg/dL (ref 100–199)
HDL: 48 mg/dL (ref >39)
LDL Chol Calc (NIH): 76 mg/dL (ref 0–99)
Triglycerides: 108 mg/dL (ref 0–149)
VLDL Cholesterol Cal: 20 mg/dL (ref 5–40)

## 2022-04-27 LAB — THYROID PANEL WITH TSH
Free Thyroxine Index: 3.7 (ref 1.2–4.9)
T3 Uptake Ratio: 31 % (ref 24–39)
T4, Total: 11.9 ug/dL (ref 4.5–12.0)
TSH: <0.005 u[IU]/mL — ABNORMAL LOW (ref 0.450–4.500)

## 2022-04-27 LAB — CMP14+EGFR
ALT: 19 IU/L (ref 0–32)
AST: 13 IU/L (ref 0–40)
Albumin/Globulin Ratio: 1.5 (ref 1.2–2.2)
Albumin: 4.8 g/dL (ref 3.9–4.9)
Alkaline Phosphatase: 115 IU/L (ref 44–121)
BUN/Creatinine Ratio: 22 (ref 12–28)
BUN: 12 mg/dL (ref 8–27)
Bilirubin Total: 0.4 mg/dL (ref 0.0–1.2)
CO2: 20 mmol/L (ref 20–29)
Calcium: 9.9 mg/dL (ref 8.7–10.3)
Chloride: 99 mmol/L (ref 96–106)
Creatinine, Ser: 0.55 mg/dL — ABNORMAL LOW (ref 0.57–1.00)
Globulin, Total: 3.1 g/dL (ref 1.5–4.5)
Glucose: 129 mg/dL — ABNORMAL HIGH (ref 70–99)
Potassium: 4.7 mmol/L (ref 3.5–5.2)
Sodium: 139 mmol/L (ref 134–144)
Total Protein: 7.9 g/dL (ref 6.0–8.5)
eGFR: 103 mL/min/{1.73_m2} (ref >59)

## 2022-04-27 NOTE — Progress Notes (Signed)
Established Patient Office Visit  Subjective    Patient ID: RADLEY TESTON, female    DOB: 1960/05/06  Age: 62 y.o. MRN: [id number]  CC:  Chief Complaint  Patient presents with   Follow-up   Diabetes    HPI Brenda Fleming presents for follow up of chronic med issues. Patient denies acute complaints.    Outpatient Encounter Medications as of 04/26/2022  Medication Sig   amLODipine (NORVASC) 5 MG tablet TAKE 1 TABLET (5 MG TOTAL) BY MOUTH DAILY.   atorvastatin (LIPITOR) 40 MG tablet TAKE 1 TABLET BY MOUTH EVERY DAY   glipiZIDE (GLUCOTROL) 5 MG tablet TAKE 1 TABLET 2 TIMES DAILY BEFORE A MEAL. DECREASE TO 1/2 BEFORE EVENING MEAL IF MEAL IS LIGHT   lisinopril (ZESTRIL) 40 MG tablet TAKE 1 TABLET BY MOUTH EVERY DAY   Melatonin 3 MG TABS Take 1 tablet by mouth at bedtime.   metFORMIN (GLUCOPHAGE-XR) 500 MG 24 hr tablet Take 2 tablets (1,000 mg total) by mouth 2 (two) times daily with a meal.   Multiple Vitamin (MULTIVITAMIN WITH MINERALS) TABS Take 1 tablet by mouth daily. Reported on 06/03/2015   No facility-administered encounter medications on file as of 04/26/2022.    Past Medical History:  Diagnosis Date   Diabetes mellitus without complication (HCC)    Type 2   GERD (gastroesophageal reflux disease)    Goiter    Hyperlipidemia associated with type 2 diabetes mellitus ([place])    Hypertension associated with diabetes ([place])    Hypothyroidism    Obesity     Past Surgical History:  Procedure Laterality Date   ABDOMINAL HYSTERECTOMY     RHINOPLASTY     TUBAL LIGATION      Family History  Problem Relation Age of Onset   Hypertension Mother    Anemia Mother    Heart disease Father    Diabetes Brother    Heart disease Brother    Diabetes Brother    Diabetes Brother    Breast cancer Paternal Aunt    Colon cancer Neg Hx    Stroke Neg Hx    Cancer Neg Hx     Social History   Socioeconomic History   Marital status: Married    Spouse name: Not on file   Number of  children: Not on file   Years of education: Not on file   Highest education level: Not on file  Occupational History   Not on file  Tobacco Use   Smoking status: Never   Smokeless tobacco: Never  Substance and Sexual Activity   Alcohol use: Yes    Alcohol/week: 0.0 standard drinks of alcohol    Comment: very occasionally   Drug use: No   Sexual activity: Yes    Birth control/protection: Surgical  Other Topics Concern   Not on file  Social History Narrative   Not on file   Social Determinants of Health   Financial Resource Strain: Not on file  Food Insecurity: Not on file  Transportation Needs: Not on file  Physical Activity: Not on file  Stress: Not on file  Social Connections: Not on file  Intimate Partner Violence: Not on file    Review of Systems  All other systems reviewed and are negative.       Objective    BP 131/80   Pulse 90   Temp 98.1 F (36.7 C) (Oral)   Resp 16   Wt 188 lb (85.3 kg)   SpO2 97%  BMI 31.28 kg/m   Physical Exam Vitals and nursing note reviewed.  Constitutional:      General: She is not in acute distress. Neck:     Thyroid: Thyromegaly present. No thyroid mass.  Cardiovascular:     Rate and Rhythm: Normal rate and regular rhythm.  Pulmonary:     Effort: Pulmonary effort is normal.     Breath sounds: Normal breath sounds.  Abdominal:     Palpations: Abdomen is soft.     Tenderness: There is no abdominal tenderness.  Musculoskeletal:     Cervical back: Normal range of motion and neck supple.  Neurological:     General: No focal deficit present.     Mental Status: She is alert and oriented to person, place, and time.         Assessment & Plan:   1. Type 2 diabetes mellitus without complication, without long-term current use of insulin (HCC) Increasing A1c and just above goal. Continue and monitor - CMP14+EGFR - Microalbumin / creatinine urine ratio  2. Essential hypertension Appears stable. Continue and  monitor - CMP14+EGFR  3. Hyperlipidemia, unspecified hyperlipidemia type continue - Lipid Panel  4. Thyromegaly Referral to gen surg for further eval/mgt - Ambulatory referral to General Surgery - Thyroid Panel With TSH    Return in about 3 months (around 07/25/2022).   Becky Sax, MD

## 2022-04-28 LAB — MICROALBUMIN / CREATININE URINE RATIO
Creatinine, Urine: 35.1 mg/dL
Microalb/Creat Ratio: 12 mg/g creat (ref 0–29)
Microalbumin, Urine: 4.2 ug/mL

## 2022-05-01 ENCOUNTER — Encounter: Payer: Self-pay | Admitting: *Deleted

## 2022-06-13 ENCOUNTER — Other Ambulatory Visit: Payer: Self-pay | Admitting: Family Medicine

## 2022-06-13 ENCOUNTER — Other Ambulatory Visit: Payer: Self-pay | Admitting: Family

## 2022-06-13 DIAGNOSIS — E785 Hyperlipidemia, unspecified: Secondary | ICD-10-CM

## 2022-06-13 DIAGNOSIS — I10 Essential (primary) hypertension: Secondary | ICD-10-CM

## 2022-07-22 ENCOUNTER — Other Ambulatory Visit: Payer: Self-pay | Admitting: Family Medicine

## 2022-07-22 DIAGNOSIS — E119 Type 2 diabetes mellitus without complications: Secondary | ICD-10-CM

## 2022-07-26 ENCOUNTER — Encounter: Payer: Self-pay | Admitting: Family Medicine

## 2022-07-26 ENCOUNTER — Ambulatory Visit: Payer: BC Managed Care – PPO | Admitting: Family Medicine

## 2022-07-26 VITALS — BP 106/71 | HR 83 | Temp 98.1°F | Resp 16 | Wt 187.8 lb

## 2022-07-26 DIAGNOSIS — E785 Hyperlipidemia, unspecified: Secondary | ICD-10-CM | POA: Diagnosis not present

## 2022-07-26 DIAGNOSIS — Z7984 Long term (current) use of oral hypoglycemic drugs: Secondary | ICD-10-CM

## 2022-07-26 DIAGNOSIS — I10 Essential (primary) hypertension: Secondary | ICD-10-CM | POA: Diagnosis not present

## 2022-07-26 DIAGNOSIS — E010 Iodine-deficiency related diffuse (endemic) goiter: Secondary | ICD-10-CM

## 2022-07-26 DIAGNOSIS — E119 Type 2 diabetes mellitus without complications: Secondary | ICD-10-CM | POA: Diagnosis not present

## 2022-07-26 LAB — POCT GLYCOSYLATED HEMOGLOBIN (HGB A1C): Hemoglobin A1C: 7.3 % — AB (ref 4.0–5.6)

## 2022-07-26 NOTE — Progress Notes (Unsigned)
Patient is here for their 3 month follow-up Patient has no concerns today Care gaps have been discussed with patient  

## 2022-07-27 NOTE — Progress Notes (Signed)
Established Patient Office Visit  Subjective    Patient ID: Brenda Fleming, female    DOB: November 24, 1960  Age: 62 y.o. MRN: [id number]  CC: No chief complaint on file.   HPI Brenda Fleming presents for routine follow up of chronic med issues. Patient denies acute complaints or concerns.    Outpatient Encounter Medications as of 07/26/2022  Medication Sig   amLODipine (NORVASC) 5 MG tablet TAKE 1 TABLET (5 MG TOTAL) BY MOUTH DAILY.   atorvastatin (LIPITOR) 40 MG tablet TAKE 1 TABLET BY MOUTH EVERY DAY   glipiZIDE (GLUCOTROL) 5 MG tablet TAKE 1 TABLET 2 TIMES DAILY BEFORE A MEAL. DECREASE TO 1/2 BEFORE EVENING MEAL IF MEAL IS LIGHT   lisinopril (ZESTRIL) 40 MG tablet TAKE 1 TABLET BY MOUTH EVERY DAY   Melatonin 3 MG TABS Take 1 tablet by mouth at bedtime.   metFORMIN (GLUCOPHAGE-XR) 500 MG 24 hr tablet TAKE 2 TABLETS BY MOUTH TWICE A DAY WITH MEALS   Multiple Vitamin (MULTIVITAMIN WITH MINERALS) TABS Take 1 tablet by mouth daily. Reported on 06/03/2015   No facility-administered encounter medications on file as of 07/26/2022.    Past Medical History:  Diagnosis Date   Diabetes mellitus without complication (HCC)    Type 2   GERD (gastroesophageal reflux disease)    Goiter    Hyperlipidemia associated with type 2 diabetes mellitus (HCC)    Hypertension associated with diabetes (HCC)    Hypothyroidism    Obesity     Past Surgical History:  Procedure Laterality Date   ABDOMINAL HYSTERECTOMY     RHINOPLASTY     TUBAL LIGATION      Family History  Problem Relation Age of Onset   Hypertension Mother    Anemia Mother    Heart disease Father    Diabetes Brother    Heart disease Brother    Diabetes Brother    Diabetes Brother    Breast cancer Paternal Aunt    Colon cancer Neg Hx    Stroke Neg Hx    Cancer Neg Hx     Social History   Socioeconomic History   Marital status: Married    Spouse name: Not on file   Number of children: Not on file   Years of education: Not  on file   Highest education level: Not on file  Occupational History   Not on file  Tobacco Use   Smoking status: Never   Smokeless tobacco: Never  Substance and Sexual Activity   Alcohol use: Yes    Alcohol/week: 0.0 standard drinks of alcohol    Comment: very occasionally   Drug use: No   Sexual activity: Yes    Birth control/protection: Surgical  Other Topics Concern   Not on file  Social History Narrative   Not on file   Social Determinants of Health   Financial Resource Strain: Not on file  Food Insecurity: Not on file  Transportation Needs: Not on file  Physical Activity: Not on file  Stress: Not on file  Social Connections: Not on file  Intimate Partner Violence: Not on file    Review of Systems  All other systems reviewed and are negative.       Objective    BP 106/71   Pulse 83   Temp 98.1 F (36.7 C) (Oral)   Resp 16   Wt 187 lb 12.8 oz (85.2 kg)   SpO2 96%   BMI 31.25 kg/m   Physical Exam Vitals and  nursing note reviewed.  Constitutional:      General: She is not in acute distress. Neck:     Thyroid: Thyromegaly present. No thyroid mass.  Cardiovascular:     Rate and Rhythm: Normal rate and regular rhythm.  Pulmonary:     Effort: Pulmonary effort is normal.     Breath sounds: Normal breath sounds.  Abdominal:     Palpations: Abdomen is soft.     Tenderness: There is no abdominal tenderness.  Musculoskeletal:     Cervical back: Normal range of motion and neck supple.  Neurological:     General: No focal deficit present.     Mental Status: She is alert and oriented to person, place, and time.         Assessment & Plan:   1. Type 2 diabetes mellitus without complication, without long-term current use of insulin (HCC) Slightly increased A1c and just above goal.  - POCT glycosylated hemoglobin (Hb A1C)  2. Essential hypertension Appears stable. continue  3. Hyperlipidemia, unspecified hyperlipidemia type Continue   4.  Thyromegaly Management per consultant. Patient reports anticipated thyroidectomy in June.     Return in about 3 months (around 10/26/2022) for follow up.   Tommie Raymond, MD

## 2022-07-29 ENCOUNTER — Other Ambulatory Visit: Payer: Self-pay | Admitting: Family Medicine

## 2022-07-29 DIAGNOSIS — I10 Essential (primary) hypertension: Secondary | ICD-10-CM

## 2022-07-31 NOTE — Telephone Encounter (Signed)
Requested Prescriptions  Pending Prescriptions Disp Refills   lisinopril (ZESTRIL) 40 MG tablet [Pharmacy Med Name: LISINOPRIL 40 MG TABLET] 90 tablet 1    Sig: TAKE 1 TABLET BY MOUTH EVERY DAY     Cardiovascular:  ACE Inhibitors Failed - 07/29/2022  9:33 AM      Failed - Cr in normal range and within 180 days    Creatinine, Ser  Date Value Ref Range Status  04/26/2022 0.55 (L) 0.57 - 1.00 mg/dL Final         Passed - K in normal range and within 180 days    Potassium  Date Value Ref Range Status  04/26/2022 4.7 3.5 - 5.2 mmol/L Final         Passed - Patient is not pregnant      Passed - Last BP in normal range    BP Readings from Last 1 Encounters:  07/26/22 106/71         Passed - Valid encounter within last 6 months    Recent Outpatient Visits           5 days ago Type 2 diabetes mellitus without complication, without long-term current use of insulin (HCC)   [place] [facility], Lauris Poag, MD   3 months ago Type 2 diabetes mellitus without complication, without long-term current use of insulin (HCC)   [place] [facility], Lauris Poag, MD   6 months ago Type 2 diabetes mellitus without complication, without long-term current use of insulin (HCC)   [place] [facility], Lauris Poag, MD   1 year ago Type 2 diabetes mellitus without complication, without long-term current use of insulin (HCC)   [place] [facility] Lauris Poag, MD   1 year ago Type 2 diabetes mellitus without complication, without long-term current use of insulin [facility])   [place] [facility], MD       Future Appointments             In 2 months Georganna Skeans, MD [facility]

## 2022-09-14 ENCOUNTER — Other Ambulatory Visit: Payer: Self-pay

## 2022-09-14 ENCOUNTER — Emergency Department (HOSPITAL_COMMUNITY)
Admission: EM | Admit: 2022-09-14 | Discharge: 2022-09-14 | Disposition: A | Discharge status: Home / Self Care | Payer: BC Managed Care – PPO | Attending: Emergency Medicine | Admitting: Emergency Medicine

## 2022-09-14 DIAGNOSIS — Z7984 Long term (current) use of oral hypoglycemic drugs: Secondary | ICD-10-CM | POA: Diagnosis not present

## 2022-09-14 DIAGNOSIS — Z79899 Other long term (current) drug therapy: Secondary | ICD-10-CM | POA: Insufficient documentation

## 2022-09-14 DIAGNOSIS — E871 Hypo-osmolality and hyponatremia: Secondary | ICD-10-CM | POA: Diagnosis not present

## 2022-09-14 DIAGNOSIS — R600 Localized edema: Secondary | ICD-10-CM | POA: Insufficient documentation

## 2022-09-14 DIAGNOSIS — R109 Unspecified abdominal pain: Secondary | ICD-10-CM | POA: Diagnosis not present

## 2022-09-14 DIAGNOSIS — R5383 Other fatigue: Secondary | ICD-10-CM

## 2022-09-14 LAB — CBC
HCT: 38.7 % (ref 36.0–46.0)
Hemoglobin: 12.2 g/dL (ref 12.0–15.0)
MCH: 24.2 pg — ABNORMAL LOW (ref 26.0–34.0)
MCHC: 31.5 g/dL (ref 30.0–36.0)
MCV: 76.6 fL — ABNORMAL LOW (ref 80.0–100.0)
Platelets: 345 10*3/uL (ref 150–400)
RBC: 5.05 MIL/uL (ref 3.87–5.11)
RDW: 13.6 % (ref 11.5–15.5)
WBC: 8.3 10*3/uL (ref 4.0–10.5)
nRBC: 0 % (ref 0.0–0.2)

## 2022-09-14 LAB — BASIC METABOLIC PANEL
Anion gap: 10 (ref 5–15)
BUN: 14 mg/dL (ref 8–23)
CO2: 20 mmol/L — ABNORMAL LOW (ref 22–32)
Calcium: 8.5 mg/dL — ABNORMAL LOW (ref 8.9–10.3)
Chloride: 100 mmol/L (ref 98–111)
Creatinine, Ser: 0.71 mg/dL (ref 0.44–1.00)
GFR, Estimated: >60 mL/min (ref >60)
Glucose, Bld: 102 mg/dL — ABNORMAL HIGH (ref 70–99)
Potassium: 4.2 mmol/L (ref 3.5–5.1)
Sodium: 130 mmol/L — ABNORMAL LOW (ref 135–145)

## 2022-09-14 LAB — URINALYSIS, ROUTINE W REFLEX MICROSCOPIC
Bilirubin Urine: NEGATIVE
Glucose, UA: NEGATIVE mg/dL
Hgb urine dipstick: NEGATIVE
Ketones, ur: NEGATIVE mg/dL
Leukocytes,Ua: NEGATIVE
Nitrite: NEGATIVE
Protein, ur: NEGATIVE mg/dL
Specific Gravity, Urine: 1.005 (ref 1.005–1.030)
pH: 6 (ref 5.0–8.0)

## 2022-09-14 LAB — TROPONIN I (HIGH SENSITIVITY)
Troponin I (High Sensitivity): <2 ng/L (ref <18)
Troponin I (High Sensitivity): <2 ng/L (ref <18)

## 2022-09-14 LAB — CBG MONITORING, ED: Glucose-Capillary: 88 mg/dL (ref 70–99)

## 2022-09-14 MED ORDER — SODIUM CHLORIDE 0.9 % IV BOLUS
1000.0000 mL | Freq: Once | INTRAVENOUS | Status: AC
  Administered 2022-09-14: 1000 mL via INTRAVENOUS

## 2022-09-14 NOTE — ED Triage Notes (Signed)
Pt arrives with reports of fatigue today and reports feeling over heated and dehydrated. Pt reports being in the heat most of the day. Pt also reports she may have taken her dose of metformin twice today, possibly a total of 4 500mg  metformin. Pt alert and oriented and VSS in triage.

## 2022-09-14 NOTE — Discharge Instructions (Signed)
Drink plenty of electrolyte fluids.  Follow-up with your doctor next week to have your sodium level rechecked.  Return to the emergency room as needed for worsening or recurrent symptoms

## 2022-09-14 NOTE — ED Provider Notes (Signed)
[place] EM[facility]ENC[facility] DEPARTMENT AT [facility] Provider Note   CSN: [id number] Arrival date & time: 09/14/22  1858     History  Chief Complaint  Patient presents with   Fatigue    Brenda Fleming is a 62 y.o. female.  HPI   Pt was cooking and then started not feeling well.  Pt developed a mild headache and stomach ache.  No chest pain or shortness of breath but she did have a tinge of pain in her upper back.  Pt did not have any vomiting or diarrhea.NO trouble urinating.  Pt has been able to drink fluids although she did have some slight nausea.  Pt states she was in a very hot room today.  She is not sure if she got dehydrated.  She is not sure if she took an extra dose of metformin today. Patient states she isScheduled for a thyroid surgical procedure this coming week.   Home Medications Prior to Admission medications   Medication Sig Start Date End Date Taking? Authorizing Provider  amLODipine (NORVASC) 5 MG tablet TAKE 1 TABLET (5 MG TOTAL) BY MOUTH DAILY. 06/13/22   Georganna Skeans, MD  atorvastatin (LIPITOR) 40 MG tablet TAKE 1 TABLET BY MOUTH EVERY DAY 06/13/22   Georganna Skeans, MD  glipiZIDE (GLUCOTROL) 5 MG tablet TAKE 1 TABLET 2 TIMES DAILY BEFORE A MEAL. DECREASE TO 1/2 BEFORE EVENING MEAL IF MEAL IS LIGHT 07/24/22   Georganna Skeans, MD  lisinopril (ZESTRIL) 40 MG tablet TAKE 1 TABLET BY MOUTH EVERY DAY 07/31/22   Georganna Skeans, MD  Melatonin 3 MG TABS Take 1 tablet by mouth at bedtime.    [provider]  metFORMIN (GLUCOPHAGE-XR) 500 MG 24 hr tablet TAKE 2 TABLETS BY MOUTH TWICE A DAY WITH MEALS 07/24/22   Georganna Skeans, MD  Multiple Vitamin (MULTIVITAMIN WITH MINERALS) TABS Take 1 tablet by mouth daily. Reported on 06/03/2015    [provider]      Allergies    Sulfonamide derivatives    Review of Systems   Review of Systems  Physical Exam Updated Vital Signs BP 138/76   Pulse 87   Temp 98 F (36.7 C) (Oral)   Resp 16   Ht 1.651 m (5'  5")   Wt 84.8 kg   SpO2 99%   BMI 31.12 kg/m  Physical Exam Vitals and nursing note reviewed.  Constitutional:      General: She is not in acute distress.    Appearance: She is well-developed.  HENT:     Head: Normocephalic and atraumatic.     Right Ear: External ear normal.     Left Ear: External ear normal.  Eyes:     General: No scleral icterus.       Right eye: No discharge.        Left eye: No discharge.     Conjunctiva/sclera: Conjunctivae normal.  Neck:     Thyroid: Thyromegaly present.     Trachea: No tracheal deviation.  Cardiovascular:     Rate and Rhythm: Normal rate and regular rhythm.  Pulmonary:     Effort: Pulmonary effort is normal. No respiratory distress.     Breath sounds: Normal breath sounds. No stridor. No wheezing or rales.  Abdominal:     General: Bowel sounds are normal. There is no distension.     Palpations: Abdomen is soft.     Tenderness: There is no abdominal tenderness. There is no guarding or rebound.  Musculoskeletal:  General: No tenderness or deformity.     Cervical back: Neck supple.     Right lower leg: Edema present.     Left lower leg: Edema present.  Skin:    General: Skin is warm and dry.     Findings: No rash.  Neurological:     General: No focal deficit present.     Mental Status: She is alert.     Cranial Nerves: No cranial nerve deficit, dysarthria or facial asymmetry.     Sensory: No sensory deficit.     Motor: No abnormal muscle tone or seizure activity.     Coordination: Coordination normal.  Psychiatric:        Mood and Affect: Mood normal.     ED Results / Procedures / Treatments   Labs (all labs ordered are listed, but only abnormal results are displayed) Labs Reviewed  BASIC METABOLIC PANEL - Abnormal; Notable for the following components:      Result Value   Sodium 130 (*)    CO2 20 (*)    Glucose, Bld 102 (*)    Calcium 8.5 (*)    All other components within normal limits  CBC - Abnormal; Notable  for the following components:   MCV 76.6 (*)    MCH 24.2 (*)    All other components within normal limits  URINALYSIS, ROUTINE W REFLEX MICROSCOPIC - Abnormal; Notable for the following components:   Color, Urine STRAW (*)    All other components within normal limits  CBG MONITORING, ED  TROPONIN I (HIGH SENSITIVITY)  TROPONIN I (HIGH SENSITIVITY)    EKG EKG Interpretation  Date/Time:  Thursday September 14 2022 19:45:38 EDT Ventricular Rate:  88 PR Interval:  176 QRS Duration: 76 QT Interval:  357 QTC Calculation: 432 R Axis:   66 Text Interpretation: Sinus rhythm Probable left atrial enlargement No significant change since last tracing Confirmed by Linwood Dibbles [phone]) on 09/14/2022 9:05:27 PM  Radiology No results found.  Procedures Procedures    Medications Ordered in ED Medications  sodium chloride 0.9 % bolus 1,000 mL (1,000 mLs Intravenous New Bag/Given 09/14/22 2139)    ED Course/ Medical Decision Making/ A&P Clinical Course as of 09/14/22 2246  Thu Sep 14, 2022  2104 Urinalysis, Routine w reflex microscopic -Urine, Clean Catch(!) nl [JK]  2104 Basic metabolic panel(!) Sodium low [JK]  2104 CBC(!) nl [JK]  2206 First Troponin is normal [JK]    Clinical Course User Index [JK] Linwood Dibbles, MD                             Medical Decision Making Problems Addressed: Hyponatremia: acute illness or injury that poses a threat to life or bodily functions Other fatigue: acute illness or injury  Amount and/or Complexity of Data Reviewed Labs: ordered. Decision-making details documented in ED Course.   Patient presented to ED for evaluation of fatigue after being in a very hot room today.  She felt like she may have been dehydrated.  Patient also experienced some vague discomfort in her back but no chest pain or shortness of breath.  ED workup is reassuring.  Serial troponins are normal.  Doubt acute coronary syndrome.  Patient is not anemic.  No signs of urinary tract  infection.  She is not hypotensive or febrile.  Patient's labs do show low sodium level.  Is possible this could be related to her dehydration.  Patient was treated with an  IV fluid bolus.  She is feeling better.  Will have her increase her electrolyte fluid intake.  Follow-up with your doctor to be rechecked.  Return to the ED for worsening symptoms        Final Clinical Impression(s) / ED Diagnoses Final diagnoses:  Hyponatremia  Other fatigue    Rx / DC Orders ED Discharge Orders     None         Linwood Dibbles, MD 09/14/22 2246

## 2022-09-18 DIAGNOSIS — E890 Postprocedural hypothyroidism: Secondary | ICD-10-CM | POA: Insufficient documentation

## 2022-09-19 NOTE — Discharge Summary (Signed)
 ------------------------------------------------------------------------------- Attestation signed by Ilah Leavy Chihuahua, MD at 09/19/2022  2:01 PM Attestation (Dr. Chihuahua):  I personally confirmed the history, performed the examination and reviewed the data and imaging for Brenda Fleming myself on the day of the service.  I saw the patient with the resident and agree with the findings and plan as documented.  Electronically signed by: Ilah LELON Chihuahua, MD 09/19/2022 2:01 PM   -------------------------------------------------------------------------------  Surgical Oncology Discharge Summary  Patient ID: Brenda Fleming [id number] 62 y.o. 14-Aug-1960  Admit date: 09/18/2022 Admitting Physician: Ilah Leavy Chihuahua, MD Admission Condition: stable Admission Diagnoses: Thyromegaly  Discharge date: 09/19/22 Discharge Physician: Ilah Leavy Chihuahua, MD Discharged Condition: good Discharge Diagnoses: Principal Problem:   Thyromegaly Active Problems:   S/P thyroidectomy Resolved Problems:   * No resolved hospital problems. *   Procedures/Surgeries performed during hospitalization:  THYROIDECTOMY  Procedure(s) (LRB): THYROIDECTOMY (N/A)  Indication for Admission:  Diagnosis as above requiring observation and/or intervention.    Hospital Course:   Jakaylah Schlafer was taken to the operating room on 09/18/22  where they underwent the above mentioned procedure without complication. She did well postoperatively. There was a concern for possible R recurrent laryngeal nerve injury during operation so the operation stopped after the removal of the R thyroid  lobe with cervical extraction of the substernal goiter and the patient was kept for observation overnight. Pt did well during her hospital stay overnight.  At the time of discharge the patient is afebrile and hemodynamically stable. Her pain is well controlled with oral pain medications. They are tolerating a diet without nausea/vomiting. The  patient has met all goals necessary for discharge from a medical and surgical standpoint and is considered stable and suitable for discharge. Discharge medications were discussed in detail and the patient stated understanding of use and administration. Instructions for appropriate wound care were also discussed. The patient verbalized understanding of all discharge instructions and therefore was released.    Treatments  Current Facility-Administered Medications:  .  acetaminophen  (TYLENOL ) tablet 650 mg, 650 mg, oral, Q6H, Audry Sabrina Sebikali-Potts, MD, 650 mg at 09/19/22 9357 .  calcium  carbonate (TUMS) 500 mg (200 mg calcium ) chewable tablet 1,000 mg, 1,000 mg, oral, Q15 Min PRN, Audry Sabrina Sebikali-Potts, MD .  dextrose  (TRUEPLUS GLUCOSE) 15 gram/32 mL oral gel 15 g, 15 g, oral, PRN, Audry Sabrina Sebikali-Potts, MD .  dextrose  injection 12.5 g, 12.5 g, intravenous, PRN, Audry Sabrina Sebikali-Potts, MD .  insulin  lispro (HumaLOG) injection 0-12 Units, 0-12 Units, subcutaneous, Before meals & nightly, Audry Sabrina Sebikali-Potts, MD, 5 Units at 09/18/22 2151 .  ondansetron  (ZOFRAN ) injection 4 mg, 4 mg, intravenous, Q6H PRN, Audry Sabrina Sebikali-Potts, MD  Medications Discontinued During This Encounter  Medication Reason  . sterile water irrigation solution Patient Discharge  . sodium chloride  (irrigation) (NS) 0.9 % irrigation solution Patient Discharge  . BUPivacaine PF (MARCAINE) 0.25 % (2.5 mg/mL) injection Patient Discharge  . fentaNYL (SUBLIMAZE) injection 50 mcg Patient Transfer  . ondansetron  (ZOFRAN ) injection 4 mg Patient Transfer  . haloperidol lactate (HALDOL) injection 1 mg Patient Transfer  . oxyCODONE (ROXICODONE) immediate release tablet 5 mg Patient Transfer  . droPERidol (INAPSINE) injection 0.625 mg   . lactated ringer's infusion      Discharge Exam: GENERAL: No acute distress, alert and oriented   HEENT: Normocephalic, atraumatic, sclerae anicteric, trachea  midline; Incision site C/D/I with no surrounding erythema. Phonation at baseline per patient and husband. CARDIOVASCULAR: Regular rate, hemodynamically stable  PULMONARY: Normal work of breathing, good  chest rise and fall ABDOMEN: Soft, non-tender, non-distended. No guarding or rebound tenderness.  EXTREMITIES: Warm well perfused, moving all extremities spontaneously SKIN: Warm, dry, intact  Disposition: Home  Patient Instructions: See attached  Discharge Medications:   Medication List     ASK your doctor about these medications    amLODIPine  5 mg tablet Commonly known as: NORVASC  Take 5 mg by mouth daily.   atorvastatin  40 mg tablet Commonly known as: LIPITOR Take 1 tablet by mouth daily.   glipiZIDE  5 mg tablet Commonly known as: GLUCOTROL  5 mg.   lisinopriL  40 mg tablet Commonly known as: PRINIVIL  Take 40 mg by mouth daily.   metFORMIN  500 mg 24 hr tablet Commonly known as: GLUCOPHAGE -XR Take 1,000 mg by mouth in the morning and 1,000 mg in the evening. Take with meals.        Discharge Instructions: The patient was instructed to call if they develop a fever greater than 101.5, any drainage from the incision, redness extending from his incision, unable to void, or any other questions or concerns.  The patient was instructed to follow-up in 1-2 weeks with her PCP: Dr. Raguel Blush (appt 10/26/22) and Dr. Jannis for a post op checkup (10/19/22). Scheduled Future Appointments       Provider Department Dept Phone Center   10/19/2022 10:45 AM Ilah Leavy Jannis [facility] - General Surgery [facility] [phone] [facility]       Time spent on discharge: 35 minutes  Electronically signed by:  Donnice Eletha Males, MD 09/19/2022 10:27 AM

## 2022-09-20 ENCOUNTER — Other Ambulatory Visit: Payer: Self-pay | Admitting: Family Medicine

## 2022-09-20 DIAGNOSIS — I10 Essential (primary) hypertension: Secondary | ICD-10-CM

## 2022-09-22 ENCOUNTER — Other Ambulatory Visit: Payer: Self-pay | Admitting: Family Medicine

## 2022-09-22 DIAGNOSIS — E785 Hyperlipidemia, unspecified: Secondary | ICD-10-CM

## 2022-10-26 ENCOUNTER — Ambulatory Visit: Payer: BC Managed Care – PPO | Admitting: Family Medicine

## 2022-10-28 ENCOUNTER — Encounter (HOSPITAL_COMMUNITY): Payer: Self-pay | Admitting: *Deleted

## 2022-10-28 ENCOUNTER — Ambulatory Visit (HOSPITAL_COMMUNITY)
Admission: EM | Admit: 2022-10-28 | Discharge: 2022-10-28 | Disposition: A | Discharge status: Home / Self Care | Payer: BC Managed Care – PPO | Attending: Emergency Medicine | Admitting: Emergency Medicine

## 2022-10-28 DIAGNOSIS — R21 Rash and other nonspecific skin eruption: Secondary | ICD-10-CM

## 2022-10-28 MED ORDER — CEPHALEXIN 500 MG PO CAPS: 500.0000 mg | ORAL_CAPSULE | Freq: Two times a day (BID) | ORAL | 0 refills | Status: AC

## 2022-10-28 MED ORDER — PREDNISONE 20 MG PO TABS: 40.0000 mg | ORAL_TABLET | Freq: Every day | ORAL | 0 refills | Status: DC

## 2022-10-28 NOTE — Discharge Instructions (Addendum)
On exam rash appears to be skin irritation, currently no signs of infection  As area of concern is over incision site prophylactically providing antibiotic to keep area from becoming infected   Begin prednisone every morning with food for 5 days to stop the inflammatory reaction and help to reduce irritation to the skin  Begin cephalexin every morning and every evening for 5 days to prevent occurrence of germs  You may cleanse daily with soap and water, pat dry, do not rub  May apply topical products such as Benadryl or hydrocortisone cream for management of itching  If your symptoms continue to persist or worsen please follow-up with urgent care or surgeon for reevaluation of symptoms

## 2022-10-28 NOTE — ED Provider Notes (Signed)
[facility]    CSN: [id number] Arrival date & time: 10/28/22  1708      History   Chief Complaint Chief Complaint  Patient presents with   Skin Problem    HPI Brenda Fleming is a 62 y.o. female.   Patient presents for evaluation of erythematous pruritic rash present at the incision site of a thyroidectomy completed on September 18, 2022.  Has follow-up visit on October 19, 2022 where bandage was removed, at that time felt a pinching sensation and had a small area of skin removed.  Since has had severe pruritus and has noticed erythema at the site.  Worsening, has started to spread over the anterior of the neck and has now moved towards the right side and to the posterior.  Has been cleansing daily.  Has applied Neosporin, hydrocortisone and taking Benadryl with no improvement.  Feels as if area has begun to help and there has been a blister.  Unsure if infected.    Past Medical History:  Diagnosis Date   Diabetes mellitus without complication (HCC)    Type 2   GERD (gastroesophageal reflux disease)    Goiter    Hyperlipidemia associated with type 2 diabetes mellitus (HCC)    Hypertension associated with diabetes (HCC)    Hypothyroidism    Obesity     Patient Active Problem List   Diagnosis Date Noted   Hyperlipidemia associated with type 2 diabetes mellitus (HCC) 03/03/2020   Thyroid enlargement 02/03/2012   DM (diabetes mellitus) (HCC) 02/03/2012   FIBROIDS, UTERUS 05/02/2007   DIABETES MELLITUS, TYPE II 05/02/2007   MIXED DISORDERS AS REACTION TO STRESS 05/02/2007   PERIMENOPAUSAL STATUS 05/02/2007    Past Surgical History:  Procedure Laterality Date   ABDOMINAL HYSTERECTOMY     RHINOPLASTY     THYROIDECTOMY     TUBAL LIGATION      OB History   No obstetric history on file.      Home Medications    Prior to Admission medications   Medication Sig Start Date End Date Taking? Authorizing Provider  amLODipine (NORVASC) 5 MG tablet TAKE 1 TABLET (5 MG  TOTAL) BY MOUTH DAILY. 09/20/22  Yes Georganna Skeans, MD  atorvastatin (LIPITOR) 40 MG tablet TAKE 1 TABLET BY MOUTH EVERY DAY 09/22/22  Yes Georganna Skeans, MD  cephALEXin (KEFLEX) 500 MG capsule Take 1 capsule (500 mg total) by mouth 2 (two) times daily for 5 days. 10/28/22 11/02/22 Yes Kimarion Chery R, NP  glipiZIDE (GLUCOTROL) 5 MG tablet TAKE 1 TABLET 2 TIMES DAILY BEFORE A MEAL. DECREASE TO 1/2 BEFORE EVENING MEAL IF MEAL IS LIGHT 07/24/22  Yes Georganna Skeans, MD  lisinopril (ZESTRIL) 40 MG tablet TAKE 1 TABLET BY MOUTH EVERY DAY 07/31/22  Yes Georganna Skeans, MD  Melatonin 3 MG TABS Take 1 tablet by mouth at bedtime.   Yes [provider]  metFORMIN (GLUCOPHAGE-XR) 500 MG 24 hr tablet TAKE 2 TABLETS BY MOUTH TWICE A DAY WITH MEALS 07/24/22  Yes Georganna Skeans, MD  Multiple Vitamin (MULTIVITAMIN WITH MINERALS) TABS Take 1 tablet by mouth daily. Reported on 06/03/2015   Yes [provider]  predniSONE (DELTASONE) 20 MG tablet Take 2 tablets (40 mg total) by mouth daily. 10/28/22  Yes Valinda Hoar, NP    Family History Family History  Problem Relation Age of Onset   Hypertension Mother    Anemia Mother    Heart disease Father    Diabetes Brother    Heart  disease Brother    Diabetes Brother    Diabetes Brother    Breast cancer Paternal Aunt    Colon cancer Neg Hx    Stroke Neg Hx    Cancer Neg Hx     Social History Social History   Tobacco Use   Smoking status: Never   Smokeless tobacco: Never  Substance Use Topics   Alcohol use: Yes    Alcohol/week: 0.0 standard drinks of alcohol    Comment: very occasionally   Drug use: No     Allergies   Sulfonamide derivatives   Review of Systems Review of Systems   Physical Exam Triage Vital Signs ED Triage Vitals  Encounter Vitals Group     BP 10/28/22 1721 126/88     Systolic BP Percentile --      Diastolic BP Percentile --      Pulse Rate 10/28/22 1721 88     Resp 10/28/22 1721 20     Temp 10/28/22 1721 98  F (36.7 C)     Temp src --      SpO2 10/28/22 1721 96 %     Weight --      Height --      Head Circumference --      Peak Flow --      Pain Score 10/28/22 1718 0     Pain Loc --      Pain Education --      Exclude from Growth Chart --    No data found.  Updated Vital Signs BP 126/88   Pulse 88   Temp 98 F (36.7 C)   Resp 20   SpO2 96%   Visual Acuity Right Eye Distance:   Left Eye Distance:   Bilateral Distance:    Right Eye Near:   Left Eye Near:    Bilateral Near:     Physical Exam Constitutional:      Appearance: Normal appearance.  HENT:     Head: Normocephalic.  Eyes:     Extraocular Movements: Extraocular movements intact.  Neck:     Comments: 2 cm horizontal incision to the center of the anterior of the neck, healed, nontender, nondraining, surrounding erythematous macular rash extending to the right lateral aspect of the neck nondraining Pulmonary:     Effort: Pulmonary effort is normal.  Neurological:     Mental Status: She is alert and oriented to person, place, and time. Mental status is at baseline.      UC Treatments / Results  Labs (all labs ordered are listed, but only abnormal results are displayed) Labs Reviewed - No data to display  EKG   Radiology No results found.  Procedures Procedures (including critical care time)  Medications Ordered in UC Medications - No data to display  Initial Impression / Assessment and Plan / UC Course  I have reviewed the triage vital signs and the nursing notes.  Pertinent labs & imaging results that were available during my care of the patient were reviewed by me and considered in my medical decision making (see chart for details).  Rash  Appears to be inflammatory similar to a contact dermatitis, discussed with patient, currently no signs of infection but due to presence over incision site we will prophylactically provide bacterial coverage, discussed this with the patient, cephalexin  prescribed as well as prednisone burst, recommended continue daily cleansing with soap and water, may continue use of topical products for management of pruritus recommended follow-up with surgeon if symptoms  continue to persist for reevaluation, patient concerned with possible streptococcal infection however at this time does not have any respiratory symptoms therefore testing deferred Final Clinical Impressions(s) / UC Diagnoses   Final diagnoses:  Rash   Discharge Instructions   None    ED Prescriptions     Medication Sig Dispense Auth. Provider   cephALEXin (KEFLEX) 500 MG capsule Take 1 capsule (500 mg total) by mouth 2 (two) times daily for 5 days. 10 capsule Kaja Jackowski R, NP   predniSONE (DELTASONE) 20 MG tablet Take 2 tablets (40 mg total) by mouth daily. 10 tablet Valinda Hoar, NP      PDMP not reviewed this encounter.   Valinda Hoar, [place] 10/29/22 [phone]

## 2022-10-28 NOTE — ED Triage Notes (Signed)
Pt reports skin itching at healed incision for thyroid ectomy. Pt has used OTC benadryl cream and today Pt put New skin over site. Pt has also taken at least 2 doses of PO benadryl.

## 2022-11-07 ENCOUNTER — Encounter: Payer: Self-pay | Admitting: Family Medicine

## 2022-11-07 ENCOUNTER — Ambulatory Visit: Payer: BC Managed Care – PPO | Admitting: Family Medicine

## 2022-11-07 VITALS — BP 106/71 | HR 87 | Temp 98.1°F | Resp 16 | Wt 190.6 lb

## 2022-11-07 DIAGNOSIS — E890 Postprocedural hypothyroidism: Secondary | ICD-10-CM

## 2022-11-07 DIAGNOSIS — I10 Essential (primary) hypertension: Secondary | ICD-10-CM | POA: Diagnosis not present

## 2022-11-07 DIAGNOSIS — Z7984 Long term (current) use of oral hypoglycemic drugs: Secondary | ICD-10-CM

## 2022-11-07 DIAGNOSIS — E119 Type 2 diabetes mellitus without complications: Secondary | ICD-10-CM

## 2022-11-07 DIAGNOSIS — E785 Hyperlipidemia, unspecified: Secondary | ICD-10-CM | POA: Diagnosis not present

## 2022-11-07 LAB — POCT GLYCOSYLATED HEMOGLOBIN (HGB A1C): Hemoglobin A1C: 7.9 % — AB (ref 4.0–5.6)

## 2022-11-07 NOTE — Progress Notes (Unsigned)
New Patient Office Visit  Subjective    Patient ID: Brenda Fleming, female    DOB: 26-Aug-1960  Age: 62 y.o. MRN: [id number]  CC: No chief complaint on file.   HPI Brenda Fleming presents to establish care Patient had surgery on thyroid late June 2024. 3/4 of her thyroid was removed.   ***  Outpatient Encounter Medications as of 11/07/2022  Medication Sig   amLODipine (NORVASC) 5 MG tablet TAKE 1 TABLET (5 MG TOTAL) BY MOUTH DAILY.   atorvastatin (LIPITOR) 40 MG tablet TAKE 1 TABLET BY MOUTH EVERY DAY   glipiZIDE (GLUCOTROL) 5 MG tablet TAKE 1 TABLET 2 TIMES DAILY BEFORE A MEAL. DECREASE TO 1/2 BEFORE EVENING MEAL IF MEAL IS LIGHT   lisinopril (ZESTRIL) 40 MG tablet TAKE 1 TABLET BY MOUTH EVERY DAY   Melatonin 3 MG TABS Take 1 tablet by mouth at bedtime.   metFORMIN (GLUCOPHAGE-XR) 500 MG 24 hr tablet TAKE 2 TABLETS BY MOUTH TWICE A DAY WITH MEALS   Multiple Vitamin (MULTIVITAMIN WITH MINERALS) TABS Take 1 tablet by mouth daily. Reported on 06/03/2015   predniSONE (DELTASONE) 20 MG tablet Take 2 tablets (40 mg total) by mouth daily.   No facility-administered encounter medications on file as of 11/07/2022.    Past Medical History:  Diagnosis Date   Diabetes mellitus without complication (HCC)    Type 2   GERD (gastroesophageal reflux disease)    Goiter    Hyperlipidemia associated with type 2 diabetes mellitus (HCC)    Hypertension associated with diabetes (HCC)    Hypothyroidism    Obesity     Past Surgical History:  Procedure Laterality Date   ABDOMINAL HYSTERECTOMY     RHINOPLASTY     THYROIDECTOMY     TUBAL LIGATION      Family History  Problem Relation Age of Onset   Hypertension Mother    Anemia Mother    Heart disease Father    Diabetes Brother    Heart disease Brother    Diabetes Brother    Diabetes Brother    Breast cancer Paternal Aunt    Colon cancer Neg Hx    Stroke Neg Hx    Cancer Neg Hx     Social History   Socioeconomic History    Marital status: Married    Spouse name: Not on file   Number of children: Not on file   Years of education: Not on file   Highest education level: Not on file  Occupational History   Not on file  Tobacco Use   Smoking status: Never   Smokeless tobacco: Never  Substance and Sexual Activity   Alcohol use: Yes    Alcohol/week: 0.0 standard drinks of alcohol    Comment: very occasionally   Drug use: No   Sexual activity: Yes    Birth control/protection: Surgical  Other Topics Concern   Not on file  Social History Narrative   Not on file   Social Determinants of Health   Financial Resource Strain: Not on file  Food Insecurity: Low Risk  (09/18/2022)   Received from [facility]   Food vital sign    Within the past 12 months, you worried that your food would run out before you got money to buy more: Never true    Within the past 12 months, the food you bought just didn't last and you didn't have money to get more. : Never true  Transportation Needs: Not on file (09/18/2022)  Physical Activity: Not on file  Stress: Not on file  Social Connections: Not on file  Intimate Partner Violence: Not on file    ROS      Objective    BP 106/71   Pulse 87   Temp 98.1 F (36.7 C) (Oral)   Resp 16   Wt 190 lb 9.6 oz (86.5 kg)   SpO2 97%   BMI 31.72 kg/m   Physical Exam  {Labs (Optional):23779}    Assessment & Plan:   Problem List Items Addressed This Visit       Endocrine   DM (diabetes mellitus) (HCC) - Primary (Chronic)   Relevant Orders   POCT glycosylated hemoglobin (Hb A1C) (Completed)    No follow-ups on file.   Tommie Raymond, MD

## 2022-11-07 NOTE — Progress Notes (Unsigned)
Patient is here for their 3 month follow-up Patient has no concerns today Care gaps have been discussed with patient  

## 2022-11-09 ENCOUNTER — Encounter: Payer: Self-pay | Admitting: Pharmacist

## 2022-11-09 ENCOUNTER — Ambulatory Visit: Payer: BC Managed Care – PPO | Attending: Family Medicine | Admitting: Pharmacist

## 2022-11-09 DIAGNOSIS — Z7984 Long term (current) use of oral hypoglycemic drugs: Secondary | ICD-10-CM | POA: Diagnosis not present

## 2022-11-09 DIAGNOSIS — E119 Type 2 diabetes mellitus without complications: Secondary | ICD-10-CM

## 2022-11-09 MED ORDER — EMPAGLIFLOZIN 10 MG PO TABS: 10.0000 mg | ORAL_TABLET | Freq: Every day | ORAL | 2 refills | Status: DC

## 2022-11-09 NOTE — Progress Notes (Signed)
    S:     No chief complaint on file.  62 y.o. female who presents for diabetes evaluation, education, and management. Patient arrives in good spirits and presents without any assistance.   Patient was referred and last seen by Primary Care Provider, Dr. Andrey Campanile, on 11/07/2022. A1c 7.9% at that visit.  PMH is significant for T2DM dx'd ~2010.   Patient reports Diabetes was diagnosed ~2010. Has been hospitalized with DM before. Has used insulin before. No hx of pancreatitis or thyroid cancer. Of note, she does have a hx of right lobectomy for a thyroid goiter. Work-up was benign. She has no known hx of clinical ASCVD, CHF, or CKD.   Family/Social History:  Fhx: HTN, heart disease, DM Tobacco: never smoker  Alcohol: none reported   Current diabetes medications include: glipizide 5 mg BID, metformin 500 mg XR - two tabs BID with meals  Patient reports adherence to taking all medications as prescribed.   Insurance coverage: BCBS  Patient denies hypoglycemic events.  Reported home blood sugars: 200s-215   Patient denies nocturia (nighttime urination).  Patient denies neuropathy (nerve pain). Patient denies visual changes. Patient reports self foot exams.   Patient reported dietary habits:  -Admits that she sometimes struggles with eating a proper diabetic diet but she is working on this.  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   Lab Results  Component Value Date   HGBA1C 7.9 (A) 11/07/2022   There were no vitals filed for this visit.  Lipid Panel     Component Value Date/Time   CHOL 144 04/26/2022 0929   TRIG 108 04/26/2022 0929   HDL 48 04/26/2022 0929   CHOLHDL 3.0 04/26/2022 0929   LDLCALC 76 04/26/2022 0929    Clinical Atherosclerotic Cardiovascular Disease (ASCVD): No  The 10-year ASCVD risk score (Arnett DK, et al., 2019) is: 9.8%   Values used to  calculate the score:     Age: 50 years     Sex: Female     Is Non-Hispanic African American: Yes     Diabetic: Yes     Tobacco smoker: No     Systolic Blood Pressure: 106 mmHg     Is BP treated: Yes     HDL Cholesterol: 48 mg/dL     Total Cholesterol: 144 mg/dL   Patient is participating in a Managed Medicaid Plan: No   A/P: Diabetes longstanding currently above goal. Patient is able to verbalize appropriate hypoglycemia management plan. Medication adherence appears appropriate. -Continued metformin and glipizide for now. -Start Jardiance 10 mg daily. Will have her return in 4-6 weeks for labs. -Patient educated on purpose, proper use, and potential adverse effects of Jardiance.  -Extensively discussed pathophysiology of diabetes, recommended lifestyle interventions, dietary effects on blood sugar control.  -Counseled on s/sx of and management of hypoglycemia.  -Next A1c anticipated 01/2023.   Written patient instructions provided. Patient verbalized understanding of treatment plan.  Total time in face to face counseling 30 minutes.    Follow-up:  Pharmacist in 4-6 weeks.  Butch Penny, PharmD, Patsy [place], CPP Clinical Pharmacist [facility] [phone]

## 2022-12-20 NOTE — Progress Notes (Unsigned)
S:    62 y.o. female who presents for diabetes evaluation, education, and management. PMH is significant for T2DM dx'd ~2010.   Patient reports Diabetes was diagnosed ~2010. Has been hospitalized with DM before. Has used insulin before. No hx of pancreatitis or thyroid cancer. Of note, she does have a hx of right lobectomy for a thyroid goiter. Work-up was benign. She has no known hx of clinical ASCVD, CHF, or CKD.   Patient was referred and last seen by Primary Care Provider, Dr. Andrey Campanile, on 11/07/2022. A1c 7.9% at that visit. Last seen by pharmacist on 11/09/2022. At visit, metformin and glipizide was continued, and Jardiance 10 mg daily was started.   Patient arrives in good spirits and presents without any assistance. Patient is accompanied by granddaughter. Patient expressed concerns with fournier gangrene and recent dehydration since starting Jardiance. Reports drinking more Pedialyte and water. Patient recently had a thyroidectomy in June 2024.   Family/Social History: Fhx: HTN, heart disease, DM Tobacco: never smoker  Alcohol: none reported   Current diabetes medications include: Jardiance 10 mg daily, glipizide 5 mg, metformin 1,000 mg XR BID Current hypertension medications include: amlodipine 5 mg daily, lisinopril 40 mg  Current hyperlipidemia medications include: atorvastatin 40 mg   Patient reports adherence to taking all medications as prescribed.   Do you feel that your medications are working for you? Yes Have you been experiencing any side effects to the medications prescribed? Yes - dehydration  Do you have any problems obtaining medications due to transportation or finances? No Insurance coverage: BCBS  Patient denies hypoglycemic events.  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  Patient denies neuropathy (nerve  pain). Patient denies visual changes. Patient reports self foot exams.   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  Lipid Panel     Component Value Date/Time   CHOL 144 04/26/2022 0929   TRIG 108 04/26/2022 0929   HDL 48 04/26/2022 0929   CHOLHDL 3.0 04/26/2022 0929   LDLCALC 76 04/26/2022 0929    Clinical Atherosclerotic Cardiovascular Disease (ASCVD): No  The 10-year ASCVD risk score (Arnett DK, et al., 2019) is: 9.8%   Values used to calculate the score:     Age: 62 years     Sex: Female     Is Non-Hispanic African American: Yes     Diabetic: Yes     Tobacco smoker: No     Systolic Blood Pressure: 106 mmHg     Is BP treated: Yes     HDL Cholesterol: 48 mg/dL     Total Cholesterol: 144 mg/dL   Patient is participating in a Managed Medicaid Plan: No   A/P: Diabetes longstanding, currently uncontrolled but improving based on recent blood glucose readings. Patient is able to verbalize appropriate hypoglycemia management plan. Medication adherence appears good. Control is suboptimal due to need for further medication management but improving.  -Continue Jardiance 10 mg daily, glipizide 5 mg, and metformin 1,000 mg XR BID -Patient educated on purpose, proper use, and potential adverse effects  of Jardiance. Spent time going over fournier gangrene and UTI risk. Patient feels better about taking after discussion. Advised her to call/message clinic if she has any concerns.  -Labs ordered: CMP - pt did not have time today. Plan for CMP at next visit to monitor electrolytes d/t dehyration reports and increase in electrolyte replacement drinks -Extensively discussed pathophysiology of  diabetes, recommended lifestyle interventions, dietary effects on blood sugar control.  -Counseled on s/sx of and management of hypoglycemia.  -Next A1c anticipated 01/2023.   ASCVD risk - primary prevention in patient with diabetes. Last LDL is 76 (04/27/2022), not at goal of <70 mg/dL. ASCVD risk factors include HTN, DM, HLD, and 10-year ASCVD risk score of 9.8%. High intensity statin indicated.  -Continue atorvastatin 40 mg daily   Hypertension longstanding, currently controlled. Blood pressure goal of <130/80 mmHg. Medication adherence good.  -Continue amlodipine 5 mg daily, lisinopril 40 mg   Written patient instructions provided. Patient verbalized understanding of treatment plan.  Total time in face to face counseling 45 minutes.    Follow-up:  Pharmacist on 01/25/2023 PCP clinic visit on 02/13/2023  Roslyn Smiling, PharmD PGY1 Pharmacy Resident 12/20/2022 10:20 PM

## 2022-12-21 ENCOUNTER — Encounter: Payer: Self-pay | Admitting: Pharmacist

## 2022-12-21 ENCOUNTER — Ambulatory Visit: Payer: BC Managed Care – PPO | Attending: Family Medicine | Admitting: Pharmacist

## 2022-12-21 DIAGNOSIS — Z7984 Long term (current) use of oral hypoglycemic drugs: Secondary | ICD-10-CM

## 2022-12-21 DIAGNOSIS — E119 Type 2 diabetes mellitus without complications: Secondary | ICD-10-CM

## 2022-12-24 ENCOUNTER — Other Ambulatory Visit: Payer: Self-pay | Admitting: Family Medicine

## 2022-12-24 DIAGNOSIS — E785 Hyperlipidemia, unspecified: Secondary | ICD-10-CM

## 2022-12-24 DIAGNOSIS — I10 Essential (primary) hypertension: Secondary | ICD-10-CM

## 2022-12-25 ENCOUNTER — Other Ambulatory Visit: Payer: Self-pay

## 2022-12-25 DIAGNOSIS — E785 Hyperlipidemia, unspecified: Secondary | ICD-10-CM

## 2022-12-25 DIAGNOSIS — I10 Essential (primary) hypertension: Secondary | ICD-10-CM

## 2022-12-25 MED ORDER — AMLODIPINE BESYLATE 5 MG PO TABS
5.0000 mg | ORAL_TABLET | Freq: Every day | ORAL | 0 refills | Status: DC
Start: 2022-12-25 — End: 2023-03-26

## 2022-12-25 MED ORDER — ATORVASTATIN CALCIUM 40 MG PO TABS
40.0000 mg | ORAL_TABLET | Freq: Every day | ORAL | 2 refills | Status: DC
Start: 2022-12-25 — End: 2023-06-25

## 2023-01-03 NOTE — Telephone Encounter (Signed)
Pt came in this morning have some concerns & side effects about the medication (jardiance) she was requesting if she could get something else she also said call her if she needs to come back into the office to sch appt!

## 2023-01-21 ENCOUNTER — Other Ambulatory Visit: Payer: Self-pay | Admitting: Family Medicine

## 2023-01-21 DIAGNOSIS — E119 Type 2 diabetes mellitus without complications: Secondary | ICD-10-CM

## 2023-01-23 ENCOUNTER — Other Ambulatory Visit: Payer: Self-pay | Admitting: Family Medicine

## 2023-01-23 ENCOUNTER — Other Ambulatory Visit: Payer: Self-pay

## 2023-01-23 DIAGNOSIS — I10 Essential (primary) hypertension: Secondary | ICD-10-CM

## 2023-01-23 MED ORDER — LISINOPRIL 40 MG PO TABS
40.0000 mg | ORAL_TABLET | Freq: Every day | ORAL | 1 refills | Status: DC
Start: 2023-01-23 — End: 2023-07-20

## 2023-01-23 NOTE — Telephone Encounter (Signed)
Requested Prescriptions  Pending Prescriptions Disp Refills   JARDIANCE 10 MG TABS tablet [Pharmacy Med Name: JARDIANCE 10 MG TABLET] 30 tablet 2    Sig: TAKE 1 TABLET BY MOUTH DAILY BEFORE BREAKFAST.     Endocrinology:  Diabetes - SGLT2 Inhibitors Passed - 01/23/2023 12:15 AM      Passed - Cr in normal range and within 360 days    Creatinine, Ser  Date Value Ref Range Status  09/14/2022 0.71 0.44 - 1.00 mg/dL Final         Passed - HBA1C is between 0 and 7.9 and within 180 days    Hemoglobin A1C  Date Value Ref Range Status  11/07/2022 7.9 (A) 4.0 - 5.6 % Final   Hgb A1c MFr Bld  Date Value Ref Range Status  07/30/2020 6.9 (H) 4.8 - 5.6 % Final    Comment:             Prediabetes: 5.7 - 6.4          Diabetes: >6.4          Glycemic control for adults with diabetes: <7.0          Passed - eGFR in normal range and within 360 days    GFR calc Af Amer  Date Value Ref Range Status  03/11/2020 103 >59 mL/min/1.73 Final    Comment:    **In accordance with recommendations from the NKF-ASN Task force,**   [facility] is in the process of updating its eGFR calculation to the   2021 CKD-EPI creatinine equation that estimates kidney function   without a race variable.    GFR, Estimated  Date Value Ref Range Status  09/14/2022 >60 >60 mL/min Final    Comment:    (NOTE) Calculated using the CKD-EPI Creatinine Equation (2021)    eGFR  Date Value Ref Range Status  04/26/2022 103 >59 mL/min/1.73 Final         Passed - Valid encounter within last 6 months    Recent Outpatient Visits           1 month ago Type 2 diabetes mellitus without complication, without long-term current use of insulin (HCC)   [facility], Gobles L, RPH-CPP   2 months ago Type 2 diabetes mellitus without complication, without long-term current use of insulin [facility])   [facility] [place], Jeannett Senior L, RPH-CPP   2 months ago  Type 2 diabetes mellitus without complication, without long-term current use of insulin (HCC)   [facility], Lauris Poag, MD   6 months ago Type 2 diabetes mellitus without complication, without long-term current use of insulin (HCC)   [place] [facility], Lauris Poag, MD   9 months ago Type 2 diabetes mellitus without complication, without long-term current use of insulin [facility])   [place] [facility], MD       Future Appointments             In 2 days Lois Huxley, Cornelius Moras, RPH-CPP [facility]   In 3 weeks Georganna Skeans, MD [facility]

## 2023-01-25 ENCOUNTER — Ambulatory Visit: Payer: BC Managed Care – PPO | Admitting: Pharmacist

## 2023-02-08 ENCOUNTER — Ambulatory Visit: Payer: BC Managed Care – PPO | Admitting: Family Medicine

## 2023-02-13 ENCOUNTER — Encounter: Payer: Self-pay | Admitting: Family Medicine

## 2023-02-13 ENCOUNTER — Ambulatory Visit: Payer: BC Managed Care – PPO | Admitting: Family Medicine

## 2023-02-13 VITALS — BP 118/77 | HR 89 | Temp 98.0°F | Resp 16 | Ht 65.0 in | Wt 184.8 lb

## 2023-02-13 DIAGNOSIS — E119 Type 2 diabetes mellitus without complications: Secondary | ICD-10-CM

## 2023-02-13 DIAGNOSIS — Z7984 Long term (current) use of oral hypoglycemic drugs: Secondary | ICD-10-CM

## 2023-02-13 DIAGNOSIS — I10 Essential (primary) hypertension: Secondary | ICD-10-CM

## 2023-02-13 DIAGNOSIS — E890 Postprocedural hypothyroidism: Secondary | ICD-10-CM | POA: Diagnosis not present

## 2023-02-13 DIAGNOSIS — E785 Hyperlipidemia, unspecified: Secondary | ICD-10-CM | POA: Diagnosis not present

## 2023-02-13 LAB — POCT GLYCOSYLATED HEMOGLOBIN (HGB A1C): Hemoglobin A1C: 6.7 % — AB (ref 4.0–5.6)

## 2023-02-13 NOTE — Progress Notes (Unsigned)
Established Patient Office Visit  Subjective    Patient ID: Brenda Fleming, female    DOB: Apr 04, 1960  Age: 62 y.o. MRN: [id number]  CC:  Chief Complaint  Patient presents with   Follow-up    3 month    HPI Brenda Fleming presents for routine follow up of chronic med issues including diabetes and hypertension. Patient continues to do well since thyroidectomy.    Outpatient Encounter Medications as of 02/13/2023  Medication Sig   amLODipine (NORVASC) 5 MG tablet TAKE 1 TABLET (5 MG TOTAL) BY MOUTH DAILY.   amLODipine (NORVASC) 5 MG tablet Take 1 tablet (5 mg total) by mouth daily.   atorvastatin (LIPITOR) 40 MG tablet TAKE 1 TABLET BY MOUTH EVERY DAY   atorvastatin (LIPITOR) 40 MG tablet Take 1 tablet (40 mg total) by mouth daily.   glipiZIDE (GLUCOTROL) 5 MG tablet TAKE 1 TABLET 2 TIMES DAILY BEFORE A MEAL. DECREASE TO 1/2 BEFORE EVENING MEAL IF MEAL IS LIGHT   JARDIANCE 10 MG TABS tablet TAKE 1 TABLET BY MOUTH DAILY BEFORE BREAKFAST. (Patient not taking: Reported on 02/13/2023)   lisinopril (ZESTRIL) 40 MG tablet TAKE 1 TABLET BY MOUTH EVERY DAY   lisinopril (ZESTRIL) 40 MG tablet Take 1 tablet (40 mg total) by mouth daily.   Melatonin 3 MG TABS Take 1 tablet by mouth at bedtime.   metFORMIN (GLUCOPHAGE-XR) 500 MG 24 hr tablet TAKE 2 TABLETS BY MOUTH TWICE A DAY WITH MEALS   Multiple Vitamin (MULTIVITAMIN WITH MINERALS) TABS Take 1 tablet by mouth daily. Reported on 06/03/2015   predniSONE (DELTASONE) 20 MG tablet Take 2 tablets (40 mg total) by mouth daily.   No facility-administered encounter medications on file as of 02/13/2023.    Past Medical History:  Diagnosis Date   Diabetes mellitus without complication (HCC)    Type 2   GERD (gastroesophageal reflux disease)    Goiter    Hyperlipidemia associated with type 2 diabetes mellitus (HCC)    Hypertension associated with diabetes (HCC)    Hypothyroidism    Obesity     Past Surgical History:  Procedure Laterality  Date   ABDOMINAL HYSTERECTOMY     RHINOPLASTY     THYROIDECTOMY     TUBAL LIGATION      Family History  Problem Relation Age of Onset   Hypertension Mother    Anemia Mother    Heart disease Father    Diabetes Brother    Heart disease Brother    Diabetes Brother    Diabetes Brother    Breast cancer Paternal Aunt    Colon cancer Neg Hx    Stroke Neg Hx    Cancer Neg Hx     Social History   Socioeconomic History   Marital status: Married    Spouse name: Not on file   Number of children: Not on file   Years of education: Not on file   Highest education level: Not on file  Occupational History   Not on file  Tobacco Use   Smoking status: Never   Smokeless tobacco: Never  Substance and Sexual Activity   Alcohol use: Yes    Alcohol/week: 0.0 standard drinks of alcohol    Comment: very occasionally   Drug use: No   Sexual activity: Yes    Birth control/protection: Surgical  Other Topics Concern   Not on file  Social History Narrative   Not on file   Social Determinants of Corporate investment banker  Strain: Low Risk  (02/13/2023)   Overall Financial Resource Strain (CARDIA)    Difficulty of Paying Living Expenses: Not hard at all  Food Insecurity: Low Risk  (09/18/2022)   Received from [facility]   Hunger Vital Sign    Worried About Running Out of Food in the Last Year: Never true    Ran Out of Food in the Last Year: Never true  Transportation Needs: Not on file (09/18/2022)  Physical Activity: Insufficiently Active (02/13/2023)   Exercise Vital Sign    Days of Exercise per Week: 2 days    Minutes of Exercise per Session: 30 min  Stress: No Stress Concern Present (02/13/2023)   [employer] of Occupational Health - Occupational Stress Questionnaire    Feeling of Stress : Not at all  Social Connections: Socially Integrated (02/13/2023)   Social Connection and Isolation Panel [NHANES]    Frequency of Communication with Friends and Family: More than three  times a week    Frequency of Social Gatherings with Friends and Family: Three times a week    Attends Religious Services: More than 4 times per year    Active Member of Clubs or Organizations: Yes    Attends Banker Meetings: More than 4 times per year    Marital Status: Married  Catering manager Violence: Not At Risk (02/13/2023)   Humiliation, Afraid, Rape, and Kick questionnaire    Fear of Current or Ex-Partner: No    Emotionally Abused: No    Physically Abused: No    Sexually Abused: No    Review of Systems  All other systems reviewed and are negative.       Objective    BP 118/77 (BP Location: Right Arm, Patient Position: Sitting, Cuff Size: Normal)   Pulse 89   Temp 98 F (36.7 C) (Oral)   Resp 16   Ht 5\' 5"  (1.651 m)   Wt 184 lb 12.8 oz (83.8 kg)   SpO2 97%   BMI 30.75 kg/m   Physical Exam Vitals and nursing note reviewed.  Constitutional:      General: She is not in acute distress. Neck:     Thyroid: No thyroid mass or thyromegaly.     Comments: Well healing surgical scar noted Cardiovascular:     Rate and Rhythm: Normal rate and regular rhythm.  Pulmonary:     Effort: Pulmonary effort is normal.     Breath sounds: Normal breath sounds.  Abdominal:     Palpations: Abdomen is soft.     Tenderness: There is no abdominal tenderness.  Musculoskeletal:     Cervical back: Normal range of motion and neck supple.  Neurological:     General: No focal deficit present.     Mental Status: She is alert and oriented to person, place, and time.         Assessment & Plan:   Type 2 diabetes mellitus without complication, without long-term current use of insulin (HCC) -     POCT glycosylated hemoglobin (Hb A1C)  Essential hypertension  Hyperlipidemia, unspecified hyperlipidemia type  S/P thyroidectomy  Diabetes mellitus treated with oral medication (HCC)     Return in about 4 months (around 06/13/2023).   Tommie Raymond, MD

## 2023-02-14 NOTE — Progress Notes (Unsigned)
S:     Chief Complaint  Patient presents with   Diabetes   62 y.o. female who presents for diabetes evaluation, education, and management. PMHx is significant for T2DM, HLD, and is s/p thyroidectomy (right lobectomy for a thyroid goiter in June 2024). No history of clinical ASCVD, CHF, or CKD.    Patient reports Diabetes was diagnosed in ~2010.   Patient was referred by Primary Care Provider, Dr. Georganna Skeans, on 11/07/2022.  She was last seen by Pharmacy clinic on 12/21/2022 at that time, patient expressed concerns about fournier gangrene and dehydration while on Jardiance. She did report increasing fluid intake at that time. No CMP was collected to assess electrolytes/hydration status due to patient being pressed for time. No medication changes were made at that time.  She was previously scheduled to follow up with pharmacy on 01/25/2023 but missed the appointment.   She last saw Dr. Andrey Campanile on 02/13/2023. At that visit visit A1c was 6.7, much improved from 7.9.  Patient arrives in  good spirits and presents without any assistance. She is still concerned about Jardiance. She reports she feels that the medication has not been helping her and is contributing to an increase in recurrent yeast infections. She admits to having struggled with recurrent yeast infections for several years. She is not currently experiencing a yeast infection flare. She expressed interest in exploring a different therapy option for diabetes.   Family/Social History:  Family History: HTN, heart disease, DM Tobacco: Never smoker  Current diabetes medications include: Jardiance 10 mg daily (no longer taking), Glipizide 5 mg daily, Metformin XR 1000 mg BID Current hypertension medications include: Amlodipine 5 mg daily, Lisinopril 40 mg daily  Current hyperlipidemia medications include: Atorvastatin 40 mg daily  Patient reports adherence to Metformin and Glipizide. She had been taking Jardiance 10 mg every other  day, but has stopped taking it at all recently.   Do you feel that your medications are working for you? Yes (excluding Jardiance) Have you been experiencing any side effects to the medications prescribed? no Do you have any problems obtaining medications due to transportation or finances? no Insurance coverage: BCBS  Patient denies hypoglycemic events.  Reported home fasting blood sugars: 110-135   Patient denies nocturia (nighttime urination).  Patient denies neuropathy (nerve pain). Patient denies visual changes.  O:   She uses the Reli-On blood glucose meter and checks her blood sugar at least once daily.    Lab Results  Component Value Date   HGBA1C 6.7 (A) 02/13/2023   There were no vitals filed for this visit.  Lipid Panel     Component Value Date/Time   CHOL 144 04/26/2022 0929   TRIG 108 04/26/2022 0929   HDL 48 04/26/2022 0929   CHOLHDL 3.0 04/26/2022 0929   LDLCALC 76 04/26/2022 0929    Clinical Atherosclerotic Cardiovascular Disease (ASCVD): No  The 10-year ASCVD risk score (Arnett DK, et al., 2019) is: 12.6%   Values used to calculate the score:     Age: 66 years     Sex: Female     Is Non-Hispanic African American: Yes     Diabetic: Yes     Tobacco smoker: No     Systolic Blood Pressure: 118 mmHg     Is BP treated: Yes     HDL Cholesterol: 48 mg/dL     Total Cholesterol: 144 mg/dL   Patient is participating in a Managed Medicaid Plan:  No   A/P: Diabetes longstanding currently  controled. A1c is at goal at 6.7 (goal <7). Patient is  able to verbalize appropriate hypoglycemia management plan. She has not been adherent to Jardiance and requested change in therapy.  -Added GLP-1  Ozempic (semaglutide) 0.25 subcutaneous once weekly.  -Discontinued SGLT2-I Jardiance (empagliflozin) 10 mg due to patient preference and concern for recurrent yeast infections -CMP ordered to assess electrolyte/hydration status from Jardiance therapy -Continued metformin XR  1000 mg BID .  -Patient educated on purpose, proper use, and potential adverse effects of Ozempic.  -Extensively discussed pathophysiology of diabetes, recommended lifestyle interventions, dietary effects on blood sugar control.  -Counseled on s/sx of and management of hypoglycemia.  -Next A1c anticipated March 2025.   ASCVD risk - primary prevention in patient with diabetes. Last LDL is 76, very nearly at goal (<70  mg/dL). ASCVD risk factors include diabetes and 10-year ASCVD risk score of 12.6%. High intensity statin indicated.  -Continued Atorvastatin 40 mg.  -Repeat cholesterol panel ordered today. Consider increasing to Atorvastatin 80 mg if LDL not at goal.  Written patient instructions provided. Patient verbalized understanding of treatment plan.   Follow-up: Follow up with clinic pharmacist in 4 weeks for new start Ozempic   Sofie Rower, PharmD [employer] PGY-1

## 2023-02-15 ENCOUNTER — Ambulatory Visit: Payer: BC Managed Care – PPO | Attending: Family Medicine | Admitting: Pharmacist

## 2023-02-15 ENCOUNTER — Encounter: Payer: Self-pay | Admitting: Family Medicine

## 2023-02-15 ENCOUNTER — Encounter: Payer: Self-pay | Admitting: Pharmacist

## 2023-02-15 DIAGNOSIS — Z7984 Long term (current) use of oral hypoglycemic drugs: Secondary | ICD-10-CM | POA: Diagnosis not present

## 2023-02-15 DIAGNOSIS — E785 Hyperlipidemia, unspecified: Secondary | ICD-10-CM | POA: Diagnosis not present

## 2023-02-15 DIAGNOSIS — E119 Type 2 diabetes mellitus without complications: Secondary | ICD-10-CM

## 2023-02-15 DIAGNOSIS — E1169 Type 2 diabetes mellitus with other specified complication: Secondary | ICD-10-CM | POA: Diagnosis not present

## 2023-02-15 MED ORDER — OZEMPIC (0.25 OR 0.5 MG/DOSE) 2 MG/3ML ~~LOC~~ SOPN
0.2500 mg | PEN_INJECTOR | SUBCUTANEOUS | 2 refills | Status: DC
Start: 2023-02-15 — End: 2023-03-22

## 2023-02-16 LAB — CMP14+EGFR
ALT: 16 [IU]/L (ref 0–32)
AST: 12 [IU]/L (ref 0–40)
Albumin: 4.5 g/dL (ref 3.9–4.9)
Alkaline Phosphatase: 97 [IU]/L (ref 44–121)
BUN/Creatinine Ratio: 15 (ref 12–28)
BUN: 11 mg/dL (ref 8–27)
Bilirubin Total: 0.3 mg/dL (ref 0.0–1.2)
CO2: 23 mmol/L (ref 20–29)
Calcium: 9.7 mg/dL (ref 8.7–10.3)
Chloride: 103 mmol/L (ref 96–106)
Creatinine, Ser: 0.71 mg/dL (ref 0.57–1.00)
Globulin, Total: 2.7 g/dL (ref 1.5–4.5)
Glucose: 122 mg/dL — ABNORMAL HIGH (ref 70–99)
Potassium: 4.7 mmol/L (ref 3.5–5.2)
Sodium: 141 mmol/L (ref 134–144)
Total Protein: 7.2 g/dL (ref 6.0–8.5)
eGFR: 95 mL/min/{1.73_m2} (ref >59)

## 2023-02-16 LAB — LIPID PANEL
Chol/HDL Ratio: 2.8 ratio (ref 0.0–4.4)
Cholesterol, Total: 138 mg/dL (ref 100–199)
HDL: 49 mg/dL (ref >39)
LDL Chol Calc (NIH): 72 mg/dL (ref 0–99)
Triglycerides: 90 mg/dL (ref 0–149)
VLDL Cholesterol Cal: 17 mg/dL (ref 5–40)

## 2023-03-19 ENCOUNTER — Ambulatory Visit: Payer: Self-pay | Admitting: Pharmacist

## 2023-03-22 ENCOUNTER — Ambulatory Visit: Payer: BC Managed Care – PPO | Admitting: Pharmacist

## 2023-03-22 ENCOUNTER — Ambulatory Visit: Payer: BC Managed Care – PPO | Attending: Family Medicine | Admitting: Pharmacist

## 2023-03-22 ENCOUNTER — Other Ambulatory Visit: Payer: Self-pay

## 2023-03-22 ENCOUNTER — Telehealth: Payer: Self-pay | Admitting: Pharmacist

## 2023-03-22 ENCOUNTER — Encounter: Payer: Self-pay | Admitting: Pharmacist

## 2023-03-22 DIAGNOSIS — E119 Type 2 diabetes mellitus without complications: Secondary | ICD-10-CM | POA: Diagnosis not present

## 2023-03-22 DIAGNOSIS — Z7984 Long term (current) use of oral hypoglycemic drugs: Secondary | ICD-10-CM

## 2023-03-22 MED ORDER — TIRZEPATIDE 2.5 MG/0.5ML ~~LOC~~ SOAJ
2.5000 mg | SUBCUTANEOUS | 1 refills | Status: DC
Start: 2023-03-22 — End: 2023-05-15

## 2023-03-22 NOTE — Progress Notes (Signed)
S:     No chief complaint on file.  62 y.o. female who presents for diabetes evaluation, education, and management. PMHx is significant for T2DM, HLD, and is s/p thyroidectomy (right lobectomy for a thyroid goiter in June 2024). No history of clinical ASCVD, CHF, or CKD.    Patient reports Diabetes was diagnosed in ~2010.   Patient was referred and last seen by Primary Care Provider, Dr. Andrey Campanile, on 02/13/2023. A1c at that visit showed good DM control at 6.7%. She was last seen by Pharmacy clinic on 02/15/2023. At that time, patient expressed concerns about VVC and Jardiance use. We stopped Jardiance and started Ozempic instead.  Patient arrives in  good spirits and presents without any assistance. She tells me today that she did not start Ozempic for fear of pancreatitis and thyroid cancer risk but she is interested in [place].  Family/Social History:  Family History: HTN, heart disease, DM Tobacco: Never smoker  Current diabetes medications include: Glipizide 5 mg daily, Metformin XR 1000 mg BID, Ozempic 0.25 mg weekly (not taking( Current hypertension medications include: Amlodipine 5 mg daily, Lisinopril 40 mg daily  Current hyperlipidemia medications include: Atorvastatin 40 mg daily  Patient reports adherence to medications.   Insurance coverage: BCBS  Patient denies hypoglycemic events.  Reported home fasting blood sugars: none reported  Patient denies nocturia (nighttime urination).  Patient denies neuropathy (nerve pain). Patient denies visual changes.  O:   She uses the Reli-On blood glucose meter and checks her blood sugar at least once daily.    Lab Results  Component Value Date   HGBA1C 6.7 (A) 02/13/2023   There were no vitals filed for this visit.  Lipid Panel     Component Value Date/Time   CHOL 138 02/15/2023 0922   TRIG 90 02/15/2023 0922   HDL 49 02/15/2023 0922   CHOLHDL 2.8 02/15/2023 0922   LDLCALC 72 02/15/2023 0922    Clinical  Atherosclerotic Cardiovascular Disease (ASCVD): No  The 10-year ASCVD risk score (Arnett DK, et al., 2019) is: 12.1%   Values used to calculate the score:     Age: 74 years     Sex: Female     Is Non-Hispanic African American: Yes     Diabetic: Yes     Tobacco smoker: No     Systolic Blood Pressure: 118 mmHg     Is BP treated: Yes     HDL Cholesterol: 49 mg/dL     Total Cholesterol: 138 mg/dL   Patient is participating in a Managed Medicaid Plan:  No   A/P: Diabetes longstanding currently controled. A1c is at goal at 6.7 (goal <7). Patient is  able to verbalize appropriate hypoglycemia management plan. She has been reluctant to start medications from a couple of classes d/t side effect risk. Discussed her concerns with Ozempic and the low risk of something like pancreatitis. Additionally, thyroid cancer risk has been demonstrated in rodents but never humans. She has a hx of partial thyroidectomy d/t thyroid nodules but has a negative hx of thyroid cancer. Additionally, I mentioned that Greggory Keen is still a medication with GLP-1 RA properties and risks of pancreatitis and thyroid cancer, while small, cannot be ruled out. She would still like to commence [facility] given that she has family members who have benefited from [facility] therapy.  -Stop Ozempic.  -Start Mounjaro 2.5 mg weekly.  -Continued metformin XR 1000 mg BID .  -Patient educated on purpose, proper use, and potential adverse effects of Mounjaro.  -  Extensively discussed pathophysiology of diabetes, recommended lifestyle interventions, dietary effects on blood sugar control.  -Counseled on s/sx of and management of hypoglycemia.  -Next A1c anticipated March 2025.   Written patient instructions provided. Patient verbalized understanding of treatment plan.   Follow-up: Follow up with clinic pharmacist in 4 weeks for new start [facility], PharmD, [place], CPP Clinical Pharmacist [facility] [phone]

## 2023-03-22 NOTE — Telephone Encounter (Signed)
Pharmacy Patient Advocate Encounter   Received notification from CoverMyMeds that prior authorization for [facility] is required/requested.   Insurance verification completed.   The patient is insured through [employer] .   Per test claim: PA required; PA submitted to above mentioned insurance via CoverMyMeds Key/confirmation #/EOC [id number] Status is pending

## 2023-03-22 NOTE — Telephone Encounter (Signed)
Can we start a PA of Mounjaro? Patient failed Ozempic.

## 2023-03-23 ENCOUNTER — Other Ambulatory Visit: Payer: Self-pay

## 2023-03-23 NOTE — Telephone Encounter (Signed)
Pharmacy Patient Advocate Encounter  Received notification from [employer] that Prior Authorization for [facility] has been APPROVED from 03/21/2023 to 12/ 25/2027  PA #/Case ID/Reference #: [id number]

## 2023-03-24 ENCOUNTER — Other Ambulatory Visit: Payer: Self-pay | Admitting: Family Medicine

## 2023-03-24 DIAGNOSIS — I10 Essential (primary) hypertension: Secondary | ICD-10-CM

## 2023-04-24 ENCOUNTER — Other Ambulatory Visit: Payer: Self-pay | Admitting: Family Medicine

## 2023-04-24 DIAGNOSIS — E119 Type 2 diabetes mellitus without complications: Secondary | ICD-10-CM

## 2023-04-26 ENCOUNTER — Ambulatory Visit: Payer: BC Managed Care – PPO | Admitting: Pharmacist

## 2023-05-15 ENCOUNTER — Ambulatory Visit: Payer: Self-pay | Attending: Family Medicine | Admitting: Pharmacist

## 2023-05-15 ENCOUNTER — Encounter: Payer: Self-pay | Admitting: Pharmacist

## 2023-05-15 DIAGNOSIS — E119 Type 2 diabetes mellitus without complications: Secondary | ICD-10-CM

## 2023-05-15 DIAGNOSIS — Z7984 Long term (current) use of oral hypoglycemic drugs: Secondary | ICD-10-CM

## 2023-05-15 NOTE — Progress Notes (Signed)
S:     No chief complaint on file.  63 y.o. female who presents for diabetes evaluation, education, and management. PMHx is significant for T2DM, HLD, and is s/p thyroidectomy (right lobectomy for a thyroid goiter in June 2024). No history of clinical ASCVD, CHF, or CKD.    Patient reports Diabetes was diagnosed in ~2010.   Patient was referred and last seen by Primary Care Provider, Dr. Andrey Campanile, on 02/13/2023. A1c at that visit showed good DM control at 6.7%. She was last seen by Pharmacy clinic on 03/22/2023. Since I started seeing her in 10/2022, we have discussed adding SGLT-2i therapy, along with the role of a GLP-1 RA or dual GLP/GIP agent in diabetes control. She initially wanted to try an agent like this for added weight loss. However, she has maintained good glycemic control with PO therapy alone. She is  concerned with side effects with the incretin mimetic agents as well as the SGLT-2i class. 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   Patient arrives in  good spirits and presents without any assistance. She never started the [facility] and continues to express concern with the incretin mimetic class. She does not wish to pursue additional therapy outside of glipizide and metformin at this time.  Family/Social History:  Family History: HTN, heart disease, DM Tobacco: Never smoker  Current diabetes medications include: Glipizide 5 mg daily, Metformin XR 1000 mg BID, Current hypertension medications include: Amlodipine 5 mg daily, Lisinopril 40 mg daily  Current hyperlipidemia medications include: Atorvastatin 40 mg daily  Patient reports adherence to medications.   Insurance coverage: BCBS  Patient denies hypoglycemic events.  Reported home fasting blood sugars: gives 1 reading over past 2 weeks of 99. Denies any readings <70 or higher than 300.  Patient denies nocturia (nighttime urination).  Patient denies neuropathy (nerve pain). Patient denies visual changes.  O:   She uses the Reli-On blood glucose meter and checks her blood sugar at least once daily.   Lab Results  Component Value Date   HGBA1C 6.7 (A) 02/13/2023   There were no vitals filed for this visit.  Lipid Panel     Component Value Date/Time   CHOL 138 02/15/2023 0922   TRIG 90 02/15/2023 0922   HDL 49 02/15/2023 0922   CHOLHDL 2.8 02/15/2023 0922   LDLCALC 72 02/15/2023 0922    Clinical Atherosclerotic Cardiovascular Disease (ASCVD): No  The 10-year ASCVD risk score (Arnett DK, et al., 2019) is: 12.1%   Values used to calculate the score:     Age: 63 years     Sex: Female     Is Non-Hispanic African American: Yes     Diabetic: Yes     Tobacco smoker: No     Systolic Blood Pressure: 118 mmHg     Is BP treated: Yes     HDL Cholesterol: 49 mg/dL     Total Cholesterol: 138 mg/dL   Patient is participating in a Managed Medicaid Plan:  No   A/P: Diabetes longstanding currently controled. A1c is at goal at 6.7 (goal <7). Patient is  able to verbalize appropriate hypoglycemia management plan. She is not symptomatic at this time. She has been reluctant to start medications from a couple of classes d/t side effect risk. -Remove Mounjaro 2.5 mg weekly.  -Continued metformin XR 1000 mg BID.  -Continued glipizide 5 mg BID. -Extensively discussed pathophysiology of diabetes, recommended lifestyle interventions, dietary effects on blood sugar control.  -Counseled on s/sx of and  management of hypoglycemia.  -Next A1c anticipated March with PCP.   Written patient instructions provided. Patient verbalized understanding of treatment plan.   Follow-up: PCP 06/19/2023.   Butch Penny, PharmD, [place], CPP Clinical Pharmacist [facility] [phone]

## 2023-06-14 ENCOUNTER — Ambulatory Visit: Payer: BC Managed Care – PPO | Admitting: Family Medicine

## 2023-06-17 ENCOUNTER — Other Ambulatory Visit: Payer: Self-pay | Admitting: Family Medicine

## 2023-06-17 DIAGNOSIS — I10 Essential (primary) hypertension: Secondary | ICD-10-CM

## 2023-06-19 ENCOUNTER — Ambulatory Visit: Payer: Self-pay | Admitting: Family Medicine

## 2023-06-19 ENCOUNTER — Encounter: Payer: Self-pay | Admitting: Family Medicine

## 2023-06-19 VITALS — BP 122/79 | HR 83 | Temp 97.6°F | Resp 18 | Ht 65.0 in | Wt 182.0 lb

## 2023-06-19 DIAGNOSIS — E119 Type 2 diabetes mellitus without complications: Secondary | ICD-10-CM

## 2023-06-19 DIAGNOSIS — I10 Essential (primary) hypertension: Secondary | ICD-10-CM

## 2023-06-19 DIAGNOSIS — E785 Hyperlipidemia, unspecified: Secondary | ICD-10-CM

## 2023-06-19 DIAGNOSIS — Z7984 Long term (current) use of oral hypoglycemic drugs: Secondary | ICD-10-CM

## 2023-06-19 LAB — POCT GLYCOSYLATED HEMOGLOBIN (HGB A1C): Hemoglobin A1C: 7.6 % — AB (ref 4.0–5.6)

## 2023-06-19 NOTE — Progress Notes (Signed)
 Established Patient Office Visit  Subjective    Patient ID: Brenda Fleming, female    DOB: 1960-11-09  Age: 63 y.o. MRN: [id number]  CC:  Chief Complaint  Patient presents with   Follow-up    4 month    HPI Brenda Fleming presents for routine follow up of chronic med issues including diabetes and hypertension. Patient reports med compliance and denies acute complaints.   Outpatient Encounter Medications as of 06/19/2023  Medication Sig   amLODipine (NORVASC) 5 MG tablet TAKE 1 TABLET (5 MG TOTAL) BY MOUTH DAILY.   atorvastatin (LIPITOR) 40 MG tablet Take 1 tablet (40 mg total) by mouth daily.   glipiZIDE (GLUCOTROL) 5 MG tablet TAKE 1 TABLET 2 TIMES DAILY BEFORE A MEAL. DECREASE TO 1/2 BEFORE EVENING MEAL IF MEAL IS LIGHT   lisinopril (ZESTRIL) 40 MG tablet Take 1 tablet (40 mg total) by mouth daily.   Melatonin 3 MG TABS Take 1 tablet by mouth at bedtime.   metFORMIN (GLUCOPHAGE-XR) 500 MG 24 hr tablet TAKE 2 TABLETS BY MOUTH TWICE A DAY WITH MEALS   Multiple Vitamin (MULTIVITAMIN WITH MINERALS) TABS Take 1 tablet by mouth daily. Reported on 06/03/2015   No facility-administered encounter medications on file as of 06/19/2023.    Past Medical History:  Diagnosis Date   Diabetes mellitus without complication (HCC)    Type 2   GERD (gastroesophageal reflux disease)    Goiter    Hyperlipidemia associated with type 2 diabetes mellitus (HCC)    Hypertension associated with diabetes (HCC)    Hypothyroidism    Obesity     Past Surgical History:  Procedure Laterality Date   ABDOMINAL HYSTERECTOMY     RHINOPLASTY     THYROIDECTOMY     TUBAL LIGATION      Family History  Problem Relation Age of Onset   Hypertension Mother    Anemia Mother    Heart disease Father    Diabetes Brother    Heart disease Brother    Diabetes Brother    Diabetes Brother    Breast cancer Paternal Aunt    Colon cancer Neg Hx    Stroke Neg Hx    Cancer Neg Hx     Social History    Socioeconomic History   Marital status: Married    Spouse name: Not on file   Number of children: Not on file   Years of education: Not on file   Highest education level: Not on file  Occupational History   Not on file  Tobacco Use   Smoking status: Never   Smokeless tobacco: Never  Substance and Sexual Activity   Alcohol use: Yes    Alcohol/week: 0.0 standard drinks of alcohol    Comment: very occasionally   Drug use: No   Sexual activity: Yes    Birth control/protection: Surgical  Other Topics Concern   Not on file  Social History Narrative   Not on file   Social Drivers of Health   Financial Resource Strain: Low Risk  (02/13/2023)   Overall Financial Resource Strain (CARDIA)    Difficulty of Paying Living Expenses: Not hard at all  Food Insecurity: Low Risk  (09/18/2022)   Received from [facility]   Hunger Vital Sign    Worried About Running Out of Food in the Last Year: Never true    Ran Out of Food in the Last Year: Never true  Transportation Needs: Not on file (09/18/2022)  Physical Activity: Insufficiently  Active (02/13/2023)   Exercise Vital Sign    Days of Exercise per Week: 2 days    Minutes of Exercise per Session: 30 min  Stress: No Stress Concern Present (02/13/2023)   [employer] of Occupational Health - Occupational Stress Questionnaire    Feeling of Stress : Not at all  Social Connections: Socially Integrated (02/13/2023)   Social Connection and Isolation Panel [NHANES]    Frequency of Communication with Friends and Family: More than three times a week    Frequency of Social Gatherings with Friends and Family: Three times a week    Attends Religious Services: More than 4 times per year    Active Member of Clubs or Organizations: Yes    Attends Banker Meetings: More than 4 times per year    Marital Status: Married  Catering manager Violence: Not At Risk (02/13/2023)   Humiliation, Afraid, Rape, and Kick questionnaire    Fear  of Current or Ex-Partner: No    Emotionally Abused: No    Physically Abused: No    Sexually Abused: No    Review of Systems  All other systems reviewed and are negative.       Objective    BP 122/79   Pulse 83   Temp 97.6 F (36.4 C) (Oral)   Resp 18   Ht 5\' 5"  (1.651 m)   Wt 182 lb (82.6 kg)   SpO2 98%   BMI 30.29 kg/m   Physical Exam Vitals and nursing note reviewed.  Constitutional:      General: She is not in acute distress. Neck:     Thyroid: No thyroid mass or thyromegaly.     Comments: Well healing surgical scar noted Cardiovascular:     Rate and Rhythm: Normal rate and regular rhythm.  Pulmonary:     Effort: Pulmonary effort is normal.     Breath sounds: Normal breath sounds.  Abdominal:     Palpations: Abdomen is soft.     Tenderness: There is no abdominal tenderness.  Musculoskeletal:     Cervical back: Normal range of motion and neck supple.  Neurological:     General: No focal deficit present.     Mental Status: She is alert and oriented to person, place, and time.         Assessment & Plan:   Type 2 diabetes mellitus without complication, without long-term current use of insulin (HCC) -     POCT glycosylated hemoglobin (Hb A1C)  Essential hypertension  Hyperlipidemia, unspecified hyperlipidemia type  Diabetes mellitus treated with oral medication (HCC)     Return in about 3 months (around 09/19/2023) for follow up, chronic med issues.   Tommie Raymond, MD

## 2023-06-23 ENCOUNTER — Other Ambulatory Visit: Payer: Self-pay | Admitting: Family Medicine

## 2023-06-23 DIAGNOSIS — E785 Hyperlipidemia, unspecified: Secondary | ICD-10-CM

## 2023-06-25 ENCOUNTER — Telehealth: Payer: Self-pay | Admitting: Emergency Medicine

## 2023-06-25 ENCOUNTER — Other Ambulatory Visit: Payer: Self-pay | Admitting: Emergency Medicine

## 2023-06-25 DIAGNOSIS — E785 Hyperlipidemia, unspecified: Secondary | ICD-10-CM

## 2023-06-25 MED ORDER — ATORVASTATIN CALCIUM 40 MG PO TABS
40.0000 mg | ORAL_TABLET | Freq: Every day | ORAL | 2 refills | Status: DC
Start: 2023-06-25 — End: 2023-07-20

## 2023-06-25 NOTE — Telephone Encounter (Signed)
 Copied from CRM [id number]. Topic: Clinical - Prescription Issue >> Jun 25, 2023 10:08 AM Geroge Baseman wrote: Reason for CRM:  atorvastatin (LIPITOR) 40 MG tablet is not available at the pharmacy, she only has one pill left. She is wondering if there is a problem since it has not been filled yet. Please call patient to advise on any issues.

## 2023-06-25 NOTE — Telephone Encounter (Signed)
 I returned patient call and no one answered.  I was unable to leave a voicemail because mailbox was full.  Medication has been refilled

## 2023-07-13 ENCOUNTER — Ambulatory Visit: Payer: Self-pay | Admitting: Pharmacist

## 2023-07-20 ENCOUNTER — Ambulatory Visit: Payer: Self-pay | Attending: Nurse Practitioner | Admitting: Pharmacist

## 2023-07-20 ENCOUNTER — Encounter: Payer: Self-pay | Admitting: Pharmacist

## 2023-07-20 DIAGNOSIS — Z7984 Long term (current) use of oral hypoglycemic drugs: Secondary | ICD-10-CM

## 2023-07-20 DIAGNOSIS — E119 Type 2 diabetes mellitus without complications: Secondary | ICD-10-CM

## 2023-07-20 DIAGNOSIS — E785 Hyperlipidemia, unspecified: Secondary | ICD-10-CM

## 2023-07-20 DIAGNOSIS — I10 Essential (primary) hypertension: Secondary | ICD-10-CM

## 2023-07-20 MED ORDER — METFORMIN HCL ER 500 MG PO TB24: 1000.0000 mg | ORAL_TABLET | Freq: Two times a day (BID) | ORAL | 1 refills | Status: DC

## 2023-07-20 MED ORDER — LISINOPRIL 40 MG PO TABS: 40.0000 mg | ORAL_TABLET | Freq: Every day | ORAL | 1 refills | Status: DC

## 2023-07-20 MED ORDER — GLIPIZIDE 5 MG PO TABS: 5.0000 mg | ORAL_TABLET | Freq: Two times a day (BID) | ORAL | 1 refills | Status: DC

## 2023-07-20 MED ORDER — ATORVASTATIN CALCIUM 40 MG PO TABS: 40.0000 mg | ORAL_TABLET | Freq: Every day | ORAL | 1 refills | Status: DC

## 2023-07-20 MED ORDER — AMLODIPINE BESYLATE 5 MG PO TABS: 5.0000 mg | ORAL_TABLET | Freq: Every day | ORAL | 1 refills | Status: DC

## 2023-07-20 NOTE — Progress Notes (Signed)
 S:     No chief complaint on file.  63 y.o. female who presents for diabetes evaluation, education, and management. PMHx is significant for T2DM, HLD, and is s/p thyroidectomy (right lobectomy for a thyroid  goiter in June 2024). No history of clinical ASCVD, CHF, or CKD.    Patient reports Diabetes was diagnosed in ~2010.   Patient was referred and last seen by Primary Care Provider, Dr. Elvan Hamel, on 06/19/2023. A1c at that visit was 7.6% (up from 6.7% previously). She was last seen by Pharmacy clinic on 05/15/2023. Since I started seeing her in 10/2022, we have discussed adding SGLT-2i therapy, along with the role of a GLP-1 RA or dual GLP/GIP agent in diabetes control. She is  concerned with side effects with the incretin mimetic agents as well as the SGLT-2i class. 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 Despite counseling concerning GLP-1's and dual GLP/GIP agents she desires to hold off on starting one of these.   Patient arrives in  good spirits and presents without any assistance. She admits today that she believes her A1c was elevated due to dietary indiscretion. She has been very busy over the last several months and tells me her diet and water intake has slipped because of this. She endorses good medication adherence. No symptoms currently.   Family/Social History:  Family History: HTN, heart disease, DM Tobacco: Never smoker  Current diabetes medications include: Glipizide  5 mg daily, Metformin  XR 1000 mg BID, Current hypertension medications include: Amlodipine  5 mg daily, Lisinopril  40 mg daily  Current hyperlipidemia medications include: Atorvastatin  40 mg daily  Patient reports adherence to medications.   Insurance coverage: BCBS  Patient denies hypoglycemic events.  Reported home fasting blood sugars: no readings given.   Patient denies nocturia (nighttime urination).  Patient denies neuropathy (nerve pain). Patient denies visual changes.  O:  She uses the  Reli-On blood glucose meter and checks her blood sugar at least once daily.   Lab Results  Component Value Date   HGBA1C 7.6 (A) 06/19/2023   There were no vitals filed for this visit.  Lipid Panel     Component Value Date/Time   CHOL 138 02/15/2023 0922   TRIG 90 02/15/2023 0922   HDL 49 02/15/2023 0922   CHOLHDL 2.8 02/15/2023 0922   LDLCALC 72 02/15/2023 0922    Clinical Atherosclerotic Cardiovascular Disease (ASCVD): No  The 10-year ASCVD risk score (Arnett DK, et al., 2019) is: 13.8%   Values used to calculate the score:     Age: 73 years     Sex: Female     Is Non-Hispanic African American: Yes     Diabetic: Yes     Tobacco smoker: No     Systolic Blood Pressure: 122 mmHg     Is BP treated: Yes     HDL Cholesterol: 49 mg/dL     Total Cholesterol: 138 mg/dL   Patient is participating in a Managed Medicaid Plan:  No   A/P: Diabetes longstanding currently uncontrolled. A1c is above goal at 7.6 (goal <7). Patient is  able to verbalize appropriate hypoglycemia management plan. She is not symptomatic at this time. She has been reluctant to start medications from a couple of classes d/t side effect risk.We will allow her time to improve diet. I also resent her rxns as 90-day fills to help with her busy schedule.   -Continued metformin  XR 1000 mg BID.  -Continued glipizide  5 mg BID. -Extensively discussed pathophysiology of diabetes, recommended lifestyle  interventions, dietary effects on blood sugar control.  -Counseled on s/sx of and management of hypoglycemia.  -Next A1c anticipated 08/2023  Written patient instructions provided. Patient verbalized understanding of treatment plan.   Follow-up: me in 4-6 weeks.  Marene Shape, PharmD, Becky Bowels, CPP Clinical Pharmacist [facility] [phone]

## 2023-08-31 ENCOUNTER — Ambulatory Visit: Payer: Self-pay | Admitting: Pharmacist

## 2023-09-20 ENCOUNTER — Ambulatory Visit: Payer: Self-pay | Attending: Family Medicine | Admitting: Pharmacist

## 2023-09-20 ENCOUNTER — Encounter: Payer: Self-pay | Admitting: Pharmacist

## 2023-09-20 DIAGNOSIS — E119 Type 2 diabetes mellitus without complications: Secondary | ICD-10-CM | POA: Diagnosis not present

## 2023-09-20 DIAGNOSIS — E785 Hyperlipidemia, unspecified: Secondary | ICD-10-CM | POA: Diagnosis not present

## 2023-09-20 DIAGNOSIS — Z7984 Long term (current) use of oral hypoglycemic drugs: Secondary | ICD-10-CM | POA: Diagnosis not present

## 2023-09-20 LAB — POCT GLYCOSYLATED HEMOGLOBIN (HGB A1C): HbA1c, POC (controlled diabetic range): 7.2 % — AB (ref 0.0–7.0)

## 2023-09-20 NOTE — Progress Notes (Signed)
 S:     No chief complaint on file.  63 y.o. female who presents for diabetes evaluation, education, and management. PMHx is significant for T2DM, HLD, and is s/p thyroidectomy (right lobectomy for a thyroid  goiter in June 2024). No history of clinical ASCVD, CHF, or CKD.    Patient reports Diabetes was diagnosed in ~2010.   Patient was referred and last seen by Primary Care Provider, Dr. Tanda, on 06/19/2023. A1c at that visit was 7.6% (up from 6.7% previously). She was last seen by Pharmacy clinic on 07/20/2023. Since I started seeing her in 10/2022, we have discussed SGLT-2i therapy, along with the role of a GLP-1 RA or dual GLP/GIP agent in diabetes control. She is  concerned with side effects with the incretin mimetic agents as well as the SGLT-2i class. 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 Despite counseling concerning GLP-1's and dual GLP/GIP agents she desires to hold off on starting one of these.   When I saw her on 07/20/23, she endorsed good medication adherence. I did not make any changes but I did resend her rxns for 90-day fills to help assist her with adherence.   Patient arrives in good spirits and presents without any assistance. She endorses good medication adherence. No symptoms currently. Her A1c today is at goal (7.2%).   Family/Social History:  Family History: HTN, heart disease, DM Tobacco: Never smoker  Current diabetes medications include: Glipizide  5 mg BID, Metformin  XR 1000 mg BID, Current hypertension medications include: Amlodipine  5 mg daily, Lisinopril  40 mg daily  Current hyperlipidemia medications include: Atorvastatin  40 mg daily  Patient reports adherence to medications.   Insurance coverage: Aet[employer]  Patient denies hypoglycemic events.  Reported home fasting blood sugars: no readings given.   Patient denies nocturia (nighttime urination).  Patient denies neuropathy (nerve pain). Patient denies visual changes.  O:  She uses the Reli-On  blood glucose meter and checks her blood sugar at least once daily.   Lab Results  Component Value Date   HGBA1C 7.6 (A) 06/19/2023   There were no vitals filed for this visit.  Lipid Panel     Component Value Date/Time   CHOL 138 02/15/2023 0922   TRIG 90 02/15/2023 0922   HDL 49 02/15/2023 0922   CHOLHDL 2.8 02/15/2023 0922   LDLCALC 72 02/15/2023 0922    Clinical Atherosclerotic Cardiovascular Disease (ASCVD): No  The 10-year ASCVD risk score (Arnett DK, et al., 2019) is: 13.8%   Values used to calculate the score:     Age: 13 years     Clincally relevant sex: Female     Is Non-Hispanic African American: Yes     Diabetic: Yes     Tobacco smoker: No     Systolic Blood Pressure: 122 mmHg     Is BP treated: Yes     HDL Cholesterol: 49 mg/dL     Total Cholesterol: 138 mg/dL   Patient is participating in a Managed Medicaid Plan:  No   A/P: Diabetes longstanding currently at goal. With her age and lack of comorbidity, I think a reasonable goal is still <7%. However, her goal per ADA guidelines is 7.0-7.5% and she is there based on today's result. Patient is able to verbalize appropriate hypoglycemia management plan. She is not symptomatic at this time.  -Continued metformin  XR 1000 mg BID.  -Continued glipizide  5 mg BID. -Extensively discussed pathophysiology of diabetes, recommended lifestyle interventions, dietary effects on blood sugar control.  -Counseled on s/sx  of and management of hypoglycemia.  -Next A1c anticipated 11/2023  Written patient instructions provided. Patient verbalized understanding of treatment plan.   Follow-up: PCP: 09/24/2023 Me: 2-3 months  Brenda Fleming, PharmD, [place], CPP Clinical Pharmacist [facility] [phone]

## 2023-09-21 ENCOUNTER — Other Ambulatory Visit: Payer: Self-pay | Admitting: Family Medicine

## 2023-09-21 DIAGNOSIS — I10 Essential (primary) hypertension: Secondary | ICD-10-CM

## 2023-09-24 ENCOUNTER — Ambulatory Visit (INDEPENDENT_AMBULATORY_CARE_PROVIDER_SITE_OTHER): Payer: Self-pay | Admitting: Family Medicine

## 2023-09-24 ENCOUNTER — Encounter: Payer: Self-pay | Admitting: Family Medicine

## 2023-09-24 VITALS — BP 118/79 | HR 81 | Wt 186.4 lb

## 2023-09-24 DIAGNOSIS — I10 Essential (primary) hypertension: Secondary | ICD-10-CM

## 2023-09-24 DIAGNOSIS — E785 Hyperlipidemia, unspecified: Secondary | ICD-10-CM | POA: Diagnosis not present

## 2023-09-24 DIAGNOSIS — E119 Type 2 diabetes mellitus without complications: Secondary | ICD-10-CM

## 2023-09-24 DIAGNOSIS — Z7984 Long term (current) use of oral hypoglycemic drugs: Secondary | ICD-10-CM | POA: Diagnosis not present

## 2023-09-24 NOTE — Progress Notes (Signed)
 Established Patient Office Visit  Subjective    Patient ID: Brenda Fleming, female    DOB: [birth year]  Age: 63 y.o. MRN: [id number]  CC:  Chief Complaint  Patient presents with   Medical Management of Chronic Issues    HPI Brenda Fleming presents for routine follow up of chronic med issues including diabetes and hypertension. Patient reports med compliance and denies acute complaints.   Outpatient Encounter Medications as of 09/24/2023  Medication Sig   amLODipine  (NORVASC ) 5 MG tablet TAKE 1 TABLET (5 MG TOTAL) BY MOUTH DAILY.   atorvastatin  (LIPITOR) 40 MG tablet Take 1 tablet (40 mg total) by mouth daily.   glipiZIDE  (GLUCOTROL ) 5 MG tablet Take 1 tablet (5 mg total) by mouth 2 (two) times daily before a meal.   lisinopril  (ZESTRIL ) 40 MG tablet Take 1 tablet (40 mg total) by mouth daily.   Melatonin 3 MG TABS Take 1 tablet by mouth at bedtime.   metFORMIN  (GLUCOPHAGE -XR) 500 MG 24 hr tablet Take 2 tablets (1,000 mg total) by mouth 2 (two) times daily with a meal.   Multiple Vitamin (MULTIVITAMIN WITH MINERALS) TABS Take 1 tablet by mouth daily. Reported on 06/03/2015   No facility-administered encounter medications on file as of 09/24/2023.    Past Medical History:  Diagnosis Date   Diabetes mellitus without complication (HCC)    Type 2   GERD (gastroesophageal reflux disease)    Goiter    Hyperlipidemia associated with type 2 diabetes mellitus (HCC)    Hypertension associated with diabetes (HCC)    Hypothyroidism    Obesity     Past Surgical History:  Procedure Laterality Date   ABDOMINAL HYSTERECTOMY     RHINOPLASTY     THYROIDECTOMY     TUBAL LIGATION      Family History  Problem Relation Age of Onset   Hypertension Mother    Anemia Mother    Heart disease Father    Diabetes Brother    Heart disease Brother    Diabetes Brother    Diabetes Brother    Breast cancer Paternal Aunt    Colon cancer Neg Hx    Stroke Neg Hx    Cancer Neg Hx     Social  History   Socioeconomic History   Marital status: Married    Spouse name: Not on file   Number of children: Not on file   Years of education: Not on file   Highest education level: Not on file  Occupational History   Not on file  Tobacco Use   Smoking status: Never   Smokeless tobacco: Never  Substance and Sexual Activity   Alcohol use: Yes    Alcohol/week: 0.0 standard drinks of alcohol    Comment: very occasionally   Drug use: No   Sexual activity: Yes    Birth control/protection: Surgical  Other Topics Concern   Not on file  Social History Narrative   Not on file   Social Drivers of Health   Financial Resource Strain: Low Risk  (02/13/2023)   Overall Financial Resource Strain (CARDIA)    Difficulty of Paying Living Expenses: Not hard at all  Food Insecurity: Low Risk  (09/18/2022)   Received from [facility]   Hunger Vital Sign    Within the past 12 months, you worried that your food would run out before you got money to buy more: Never true    Within the past 12 months, the food you bought just didn't  last and you didn't have money to get more. : Never true  Transportation Needs: Not on file (09/18/2022)  Physical Activity: Insufficiently Active (02/13/2023)   Exercise Vital Sign    Days of Exercise per Week: 2 days    Minutes of Exercise per Session: 30 min  Stress: No Stress Concern Present (02/13/2023)   [employer] of Occupational Health - Occupational Stress Questionnaire    Feeling of Stress : Not at all  Social Connections: Socially Integrated (02/13/2023)   Social Connection and Isolation Panel    Frequency of Communication with Friends and Family: More than three times a week    Frequency of Social Gatherings with Friends and Family: Three times a week    Attends Religious Services: More than 4 times per year    Active Member of Clubs or Organizations: Yes    Attends Banker Meetings: More than 4 times per year    Marital Status:  Married  Catering manager Violence: Not At Risk (02/13/2023)   Humiliation, Afraid, Rape, and Kick questionnaire    Fear of Current or Ex-Partner: No    Emotionally Abused: No    Physically Abused: No    Sexually Abused: No    Review of Systems  All other systems reviewed and are negative.       Objective    BP 118/79 (BP Location: Right Arm, Patient Position: Sitting, Cuff Size: Normal)   Pulse 81   Wt 186 lb 6.4 oz (84.6 kg)   SpO2 95%   BMI 31.02 kg/m   Physical Exam Vitals and nursing note reviewed.  Constitutional:      General: She is not in acute distress. Neck:     Thyroid : No thyroid  mass or thyromegaly.   Cardiovascular:     Rate and Rhythm: Normal rate and regular rhythm.  Pulmonary:     Effort: Pulmonary effort is normal.     Breath sounds: Normal breath sounds.  Abdominal:     Palpations: Abdomen is soft.     Tenderness: There is no abdominal tenderness.   Musculoskeletal:     Cervical back: Normal range of motion and neck supple.   Neurological:     General: No focal deficit present.     Mental Status: She is alert and oriented to person, place, and time.         Assessment & Plan:   1. Type 2 diabetes mellitus without complication, without long-term current use of insulin  (HCC) (Primary) Improved A1c and just above goal. Continue   2. Essential hypertension Appears stable. Continue   3. Hyperlipidemia, unspecified hyperlipidemia type Continue     Return in about 3 months (around 12/25/2023) for follow up.   Tanda Raguel SQUIBB, MD

## 2023-11-15 DIAGNOSIS — R590 Localized enlarged lymph nodes: Secondary | ICD-10-CM | POA: Diagnosis not present

## 2023-11-15 DIAGNOSIS — E042 Nontoxic multinodular goiter: Secondary | ICD-10-CM | POA: Diagnosis not present

## 2023-11-15 DIAGNOSIS — E041 Nontoxic single thyroid nodule: Secondary | ICD-10-CM | POA: Diagnosis not present

## 2023-11-15 NOTE — Progress Notes (Signed)
 Endocrine Surgery Thyroid  Follow Up Clinic Visit  Subjective:  Brenda Fleming is a 63 y.o. female who has been followed in our clinic for a substernal goiter.  She underwent a right lobectomy in June 2024.  Her nerve was signalling but only at a lower threshold so out of an abundance of caution we stopped with just the right lobe. Path was benign so we planned to just observe the smaller nodules on the left rather than perform a completion thyroidectomy.  Since the last visit, she has done well.  She denies trouble with her voice or swallowing.   Past Medical History: She  has a past medical history of Diabetes    (CMD), Hypercholesteremia, and Hypertension.  Past Surgical History: Surgical History[1]  Medications: Current Medications[2] Allergies: Allergies[3]  Family History:  The patient family history includes Breast cancer in her paternal aunt; Cancer in her mother.   Social History:  The patient  reports that she has never smoked. She has never been exposed to tobacco smoke. She has never used smokeless tobacco.  The patient  reports no history of alcohol use.   Review of Systems: A comprehensive 14-point review of systems was negative except for stated in history.  Physical Exam: Vitals:   11/15/23 0931  BP: 134/76  BP Location: Left arm  Patient Position: Sitting  Pulse: 77  Resp: 18  Temp: 97 F (36.1 C)  TempSrc: Temporal  SpO2: 99%  Weight: 87.5 kg (193 lb)  Height: 1.651 m (5' 5)   Constitutional: Well-appearing, no acute distress  Eyes: No proptosis or lid lag Thyroid : palpable left thyroid  Ears, nose, mouth, throat: Hearing grossly intact, moist mucous membranes  Cardiovascular: Normal rate, appears well perfused Respiratory:   Symmetric expansion of the chest, non labored breathing Musculoskeletal: No tremor  Skin: Well healed transverse cervical incision Psych: Normal mood and affect  Labs: Will check a TSH.  TSH post op was 2.1, normal.   Imaging: I  viewed her ultrasound images.  The read is pending but the left thyroid  looks grossly similar to prior.   Assessment/Plan: Chery Giusto is a 63 y.o. female who is followed in our clinic for thyroid  nodules.  We will await the final ultrasound read and adjust the plan as necessary.  Assuming no concerns, since she is asymptomatic, will just repeat an ultrasound in 1 year with a visit right afterwards. She agreed to contact us  should she start developing compressive symptoms.    - Follow up with our clinic in 1 year(s).   -Ultrasound prior  Electronically signed by: Ilah LELON Chihuahua, MD 11/15/2023 9:42 AM   Electronically signed by:  Ilah LELON Chihuahua, MD 11/15/2023 8:42 AM       [1] Past Surgical History: Procedure Laterality Date  . THYROIDECTOMY N/A 09/18/2022   THYROIDECTOMY performed by Ilah Leavy Chihuahua, MD at [facility]  . VAGINAL HYSTERECTOMY    [2]  Current Outpatient Medications:  .  amLODIPine  (NORVASC ) 5 mg tablet, Take 5 mg by mouth daily., Disp: , Rfl:  .  atorvastatin  (LIPITOR) 40 mg tablet, Take 1 tablet by mouth daily., Disp: , Rfl:  .  glipiZIDE  (GLUCOTROL ) 5 mg tablet, 5 mg., Disp: , Rfl:  .  lisinopriL  (PRINIVIL ) 40 mg tablet, Take 40 mg by mouth daily., Disp: , Rfl:  .  metFORMIN  (GLUCOPHAGE -XR) 500 mg 24 hr tablet, Take 1,000 mg by mouth in the morning and 1,000 mg in the evening. Take with meals., Disp: , Rfl:  [3] Allergies  Allergen Reactions  . Sulfa (Sulfonamide Antibiotics) Other (See Comments)    unknown  Childhood  . Sulfamide     unknown

## 2023-11-22 ENCOUNTER — Ambulatory Visit: Admitting: Pharmacist

## 2023-12-25 ENCOUNTER — Encounter: Payer: Self-pay | Admitting: Family Medicine

## 2023-12-25 ENCOUNTER — Ambulatory Visit (INDEPENDENT_AMBULATORY_CARE_PROVIDER_SITE_OTHER): Admitting: Family Medicine

## 2023-12-25 VITALS — BP 130/76 | HR 90 | Ht 65.0 in | Wt 190.4 lb

## 2023-12-25 DIAGNOSIS — E119 Type 2 diabetes mellitus without complications: Secondary | ICD-10-CM

## 2023-12-25 DIAGNOSIS — I10 Essential (primary) hypertension: Secondary | ICD-10-CM | POA: Diagnosis not present

## 2023-12-25 DIAGNOSIS — E785 Hyperlipidemia, unspecified: Secondary | ICD-10-CM | POA: Diagnosis not present

## 2023-12-25 DIAGNOSIS — Z7984 Long term (current) use of oral hypoglycemic drugs: Secondary | ICD-10-CM | POA: Diagnosis not present

## 2023-12-25 LAB — POCT GLYCOSYLATED HEMOGLOBIN (HGB A1C): HbA1c, POC (controlled diabetic range): 7.6 % — AB (ref 0.0–7.0)

## 2023-12-25 NOTE — Progress Notes (Signed)
 Established Patient Office Visit  Subjective    Patient ID: Brenda Fleming, female    DOB: 1961/03/07  Age: 63 y.o. MRN: [id number]  CC:  Chief Complaint  Patient presents with   Medical Management of Chronic Issues    Needs referral for diabetic eye exam     HPI Brenda Fleming presents for routine follow up of chronic med issues including diabetes, hypertension. Patient reports med compliance and denies acute complaints.   Outpatient Encounter Medications as of 12/25/2023  Medication Sig   amLODipine  (NORVASC ) 5 MG tablet TAKE 1 TABLET (5 MG TOTAL) BY MOUTH DAILY.   atorvastatin  (LIPITOR) 40 MG tablet Take 1 tablet (40 mg total) by mouth daily.   glipiZIDE  (GLUCOTROL ) 5 MG tablet Take 1 tablet (5 mg total) by mouth 2 (two) times daily before a meal.   lisinopril  (ZESTRIL ) 40 MG tablet Take 1 tablet (40 mg total) by mouth daily.   Melatonin 3 MG TABS Take 1 tablet by mouth at bedtime.   metFORMIN  (GLUCOPHAGE -XR) 500 MG 24 hr tablet Take 2 tablets (1,000 mg total) by mouth 2 (two) times daily with a meal.   Multiple Vitamin (MULTIVITAMIN WITH MINERALS) TABS Take 1 tablet by mouth daily. Reported on 06/03/2015   No facility-administered encounter medications on file as of 12/25/2023.    Past Medical History:  Diagnosis Date   Diabetes mellitus without complication (HCC)    Type 2   GERD (gastroesophageal reflux disease)    Goiter    Hyperlipidemia associated with type 2 diabetes mellitus (HCC)    Hypertension associated with diabetes (HCC)    Hypothyroidism    Obesity     Past Surgical History:  Procedure Laterality Date   ABDOMINAL HYSTERECTOMY     RHINOPLASTY     THYROIDECTOMY     TUBAL LIGATION      Family History  Problem Relation Age of Onset   Hypertension Mother    Anemia Mother    Heart disease Father    Diabetes Brother    Heart disease Brother    Diabetes Brother    Diabetes Brother    Breast cancer Paternal Aunt    Colon cancer Neg Hx    Stroke Neg  Hx    Cancer Neg Hx     Social History   Socioeconomic History   Marital status: Married    Spouse name: Not on file   Number of children: Not on file   Years of education: Not on file   Highest education level: Not on file  Occupational History   Not on file  Tobacco Use   Smoking status: Never   Smokeless tobacco: Never  Substance and Sexual Activity   Alcohol use: Yes    Alcohol/week: 0.0 standard drinks of alcohol    Comment: very occasionally   Drug use: No   Sexual activity: Yes    Birth control/protection: Surgical  Other Topics Concern   Not on file  Social History Narrative   Not on file   Social Drivers of Health   Financial Resource Strain: Low Risk  (02/13/2023)   Overall Financial Resource Strain (CARDIA)    Difficulty of Paying Living Expenses: Not hard at all  Food Insecurity: Low Risk  (09/18/2022)   Received from [facility]   Hunger Vital Sign    Within the past 12 months, you worried that your food would run out before you got money to buy more: Never true    Within the  past 12 months, the food you bought just didn't last and you didn't have money to get more. : Never true  Transportation Needs: Not on file (09/18/2022)  Physical Activity: Insufficiently Active (02/13/2023)   Exercise Vital Sign    Days of Exercise per Week: 2 days    Minutes of Exercise per Session: 30 min  Stress: No Stress Concern Present (02/13/2023)   Brenda Fleming of Occupational Health - Occupational Stress Questionnaire    Feeling of Stress : Not at all  Social Connections: Socially Integrated (02/13/2023)   Social Connection and Isolation Panel    Frequency of Communication with Friends and Family: More than three times a week    Frequency of Social Gatherings with Friends and Family: Three times a week    Attends Religious Services: More than 4 times per year    Active Member of Clubs or Organizations: Yes    Attends Banker Meetings: More than 4 times  per year    Marital Status: Married  Catering manager Violence: Not At Risk (02/13/2023)   Humiliation, Afraid, Rape, and Kick questionnaire    Fear of Current or Ex-Partner: No    Emotionally Abused: No    Physically Abused: No    Sexually Abused: No    Review of Systems  All other systems reviewed and are negative.       Objective    BP 130/76   Pulse 90   Ht 5' 5 (1.651 m)   Wt 190 lb 6.4 oz (86.4 kg)   SpO2 95%   BMI 31.68 kg/m   Physical Exam Vitals and nursing note reviewed.  Constitutional:      General: She is not in acute distress. Neck:     Thyroid : No thyroid  mass.  Cardiovascular:     Rate and Rhythm: Normal rate and regular rhythm.  Pulmonary:     Effort: Pulmonary effort is normal.     Breath sounds: Normal breath sounds.  Abdominal:     Palpations: Abdomen is soft.     Tenderness: There is no abdominal tenderness.  Musculoskeletal:     Cervical back: Normal range of motion and neck supple.  Neurological:     General: No focal deficit present.     Mental Status: She is alert and oriented to person, place, and time.         Assessment & Plan:  1. Type 2 diabetes mellitus without complication, without long-term current use of insulin  (HCC) (Primary) Slightly increased A1c and above goal. Follow up as scheduled with [name]. Patient to schedule with optometry.  - POCT glycosylated hemoglobin (Hb A1C)  2. Essential hypertension Appears stable. continue  3. Hyperlipidemia, unspecified hyperlipidemia type Continue     Return in about 3 months (around 03/25/2024) for follow up, chronic med issues.   Tanda Raguel SQUIBB, MD

## 2023-12-30 NOTE — Progress Notes (Unsigned)
 S:     No chief complaint on file.  63 y.o. female who presents for diabetes evaluation, education, and management. PMHx is significant for T2DM, HLD, and is s/p thyroidectomy (right lobectomy for a thyroid  goiter in June 2024). No history of clinical ASCVD, CHF, or CKD.    Patient reports Diabetes was diagnosed in ~2010.   Patient was referred and last seen by Primary Care Provider, Dr. Tanda, on 12/25/2023. A1c at that visit was 7.6% (up from 7.2% previously). Dr. Tanda did not make any changes, but noted patient was going to see optometry. She was last seen by Pharmacy clinic on 09/20/2023. No changes were made at that visit.  Since [name], PharmD started seeing her in 10/2022, they have discussed SGLT-2i therapy, along with the role of a GLP-1 RA or dual GLP/GIP agent in diabetes control. She is concerned with side effects with the incretin mimetic agents as well as the SGLT-2i class. 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 Despite counseling concerning GLP-1's and dual GLP/GIP agents she desires to hold off on starting one of these.   Patient arrives in good spirits and presents without any assistance. She endorses good medication adherence. No symptoms currently. Admits to some dietary indiscretion. Was drinking tea that was sweetened with sugar prior to last month's visit with Dr. Tanda.   Patient has not been exercising like she should. States that with the weather cooling down that she would start walking again. If she is not walking, she will use the stationary bike at home.  Family/Social History:  Family History: HTN, heart disease, DM Tobacco: Never smoker  Current diabetes medications include: Glipizide  5 mg BID, Metformin  XR 1000 mg BID, Current hypertension medications include: Amlodipine  5 mg daily, Lisinopril  40 mg daily  Current hyperlipidemia medications include: Atorvastatin  40 mg daily  Patient reports adherence to medications.   Insurance coverage:  Aetna  Patient denies hypoglycemic events.  Reported home fasting blood sugars: 155 mg/dL yesterday, previously in the 200s when she was drinking [place] tea with sugar.  Patient denies nocturia (nighttime urination).  Patient denies neuropathy (nerve pain). Patient denies visual changes. Will get eyes examined.  O:  She uses the Reli-On blood glucose meter and checks her blood sugar at least once daily.   Lab Results  Component Value Date   HGBA1C 7.6 (A) 12/25/2023   There were no vitals filed for this visit.  Lipid Panel     Component Value Date/Time   CHOL 138 02/15/2023 0922   TRIG 90 02/15/2023 0922   HDL 49 02/15/2023 0922   CHOLHDL 2.8 02/15/2023 0922   LDLCALC 72 02/15/2023 0922    Clinical Atherosclerotic Cardiovascular Disease (ASCVD): No  The 10-year ASCVD risk score (Arnett DK, et al., 2019) is: 15.9%   Values used to calculate the score:     Age: 38 years     Clincally relevant sex: Female     Is Non-Hispanic African American: Yes     Diabetic: Yes     Tobacco smoker: No     Systolic Blood Pressure: 130 mmHg     Is BP treated: Yes     HDL Cholesterol: 49 mg/dL     Total Cholesterol: 138 mg/dL   Patient is participating in a Managed Medicaid Plan:  No   A/P: Diabetes longstanding currently at goal. With her age and lack of comorbidity, I think a reasonable goal is still <7%. However, her goal per ADA guidelines is 7.0-7.5% and she  is there based on today's result. Patient is able to verbalize appropriate hypoglycemia management plan. She is not symptomatic at this time.  -Continued metformin  XR 1000 mg BID.  -Continued glipizide  5 mg BID. -Extensively discussed pathophysiology of diabetes, recommended lifestyle interventions, dietary effects on blood sugar control.  -Counseled on s/sx of and management of hypoglycemia.  -Next A1c anticipated 03/2024  Written patient instructions provided. Patient verbalized understanding of treatment plan.    Follow-up: PCP: 04/07/2024 with Dr. Tanda Pharmacy 04/08/2023   Jenkins Graces, PharmD PGY1 Pharmacy Resident [phone]

## 2023-12-31 ENCOUNTER — Encounter: Payer: Self-pay | Admitting: Pharmacist

## 2023-12-31 ENCOUNTER — Ambulatory Visit: Attending: Family Medicine | Admitting: Pharmacist

## 2023-12-31 DIAGNOSIS — E119 Type 2 diabetes mellitus without complications: Secondary | ICD-10-CM | POA: Diagnosis not present

## 2023-12-31 DIAGNOSIS — Z7984 Long term (current) use of oral hypoglycemic drugs: Secondary | ICD-10-CM

## 2024-01-15 ENCOUNTER — Other Ambulatory Visit: Payer: Self-pay

## 2024-01-15 DIAGNOSIS — Z1231 Encounter for screening mammogram for malignant neoplasm of breast: Secondary | ICD-10-CM

## 2024-01-19 ENCOUNTER — Ambulatory Visit
Admission: RE | Admit: 2024-01-19 | Discharge: 2024-01-19 | Disposition: A | Discharge status: Home / Self Care | Source: Ambulatory Visit | Attending: Family Medicine | Admitting: Family Medicine

## 2024-01-19 DIAGNOSIS — Z1231 Encounter for screening mammogram for malignant neoplasm of breast: Secondary | ICD-10-CM

## 2024-02-22 ENCOUNTER — Other Ambulatory Visit: Payer: Self-pay | Admitting: Family Medicine

## 2024-02-22 DIAGNOSIS — E119 Type 2 diabetes mellitus without complications: Secondary | ICD-10-CM

## 2024-03-01 ENCOUNTER — Other Ambulatory Visit: Payer: Self-pay | Admitting: Family Medicine

## 2024-03-01 DIAGNOSIS — E785 Hyperlipidemia, unspecified: Secondary | ICD-10-CM

## 2024-04-01 ENCOUNTER — Ambulatory Visit (INDEPENDENT_AMBULATORY_CARE_PROVIDER_SITE_OTHER): Admitting: Family Medicine

## 2024-04-01 ENCOUNTER — Encounter: Payer: Self-pay | Admitting: Family Medicine

## 2024-04-01 VITALS — BP 120/82 | HR 78 | Ht 65.0 in | Wt 187.0 lb

## 2024-04-01 DIAGNOSIS — Z7984 Long term (current) use of oral hypoglycemic drugs: Secondary | ICD-10-CM

## 2024-04-01 DIAGNOSIS — I10 Essential (primary) hypertension: Secondary | ICD-10-CM

## 2024-04-01 DIAGNOSIS — R21 Rash and other nonspecific skin eruption: Secondary | ICD-10-CM

## 2024-04-01 DIAGNOSIS — E119 Type 2 diabetes mellitus without complications: Secondary | ICD-10-CM | POA: Diagnosis not present

## 2024-04-01 MED ORDER — TRIAMCINOLONE ACETONIDE 0.1 % EX CREA: 1.0000 | TOPICAL_CREAM | Freq: Two times a day (BID) | CUTANEOUS | 0 refills | Status: AC

## 2024-04-01 NOTE — Progress Notes (Signed)
 "  Established Patient Office Visit  Subjective    Patient ID: Brenda Fleming, female    DOB: 01-Feb-1961  Age: 64 y.o. MRN: [id number]  CC:  Chief Complaint  Patient presents with   Medical Management of Chronic Issues    Pt reports itching around her neck - possible heat rash?     HPI Brenda Fleming presents for routine follow up of chronic med issues including diabetes and hypertension. Patient reports med compliance. She also reports an intermittent rash primarily on her neck that worsens with sweating.   Outpatient Encounter Medications as of 04/01/2024  Medication Sig   amLODipine  (NORVASC ) 5 MG tablet TAKE 1 TABLET (5 MG TOTAL) BY MOUTH DAILY.   atorvastatin  (LIPITOR) 40 MG tablet TAKE 1 TABLET BY MOUTH EVERY DAY   glipiZIDE  (GLUCOTROL ) 5 MG tablet TAKE 1 TABLET (5 MG TOTAL) BY MOUTH TWICE A DAY BEFORE MEALS   lisinopril  (ZESTRIL ) 40 MG tablet Take 1 tablet (40 mg total) by mouth daily.   Melatonin 3 MG TABS Take 1 tablet by mouth at bedtime.   metFORMIN  (GLUCOPHAGE -XR) 500 MG 24 hr tablet Take 2 tablets (1,000 mg total) by mouth 2 (two) times daily with a meal.   Multiple Vitamin (MULTIVITAMIN WITH MINERALS) TABS Take 1 tablet by mouth daily. Reported on 06/03/2015   triamcinolone  cream (KENALOG ) 0.1 % Apply 1 Application topically 2 (two) times daily.   No facility-administered encounter medications on file as of 04/01/2024.    Past Medical History:  Diagnosis Date   Diabetes mellitus without complication (HCC)    Type 2   GERD (gastroesophageal reflux disease)    Goiter    Hyperlipidemia associated with type 2 diabetes mellitus (HCC)    Hypertension associated with diabetes (HCC)    Hypothyroidism    Obesity     Past Surgical History:  Procedure Laterality Date   ABDOMINAL HYSTERECTOMY     RHINOPLASTY     THYROIDECTOMY     TUBAL LIGATION      Family History  Problem Relation Age of Onset   Hypertension Mother    Anemia Mother    Heart disease Father     Diabetes Brother    Heart disease Brother    Diabetes Brother    Diabetes Brother    Breast cancer Paternal Aunt    Colon cancer Neg Hx    Stroke Neg Hx    Cancer Neg Hx     Social History   Socioeconomic History   Marital status: Married    Spouse name: Not on file   Number of children: Not on file   Years of education: Not on file   Highest education level: Not on file  Occupational History   Not on file  Tobacco Use   Smoking status: Never   Smokeless tobacco: Never  Substance and Sexual Activity   Alcohol use: Yes    Alcohol/week: 0.0 standard drinks of alcohol    Comment: very occasionally   Drug use: No   Sexual activity: Yes    Birth control/protection: Surgical  Other Topics Concern   Not on file  Social History Narrative   Not on file   Social Drivers of Health   Tobacco Use: Low Risk (04/01/2024)   Patient History    Smoking Tobacco Use: Never    Smokeless Tobacco Use: Never    Passive Exposure: Not on file  Financial Resource Strain: Low Risk (02/13/2023)   Overall Financial Resource Strain (CARDIA)  Difficulty of Paying Living Expenses: Not hard at all  Food Insecurity: Low Risk (09/18/2022)   Received from [facility]   Epic    Within the past 12 months, you worried that your food would run out before you got money to buy more: Never true    Within the past 12 months, the food you bought just didn't last and you didn't have money to get more. : Never true  Transportation Needs: No Transportation Needs (09/18/2022)   Received from [facility]   Transportation    In the past 12 months, has lack of reliable transportation kept you from medical appointments, meetings, work or from getting things needed for daily living? : No  Physical Activity: Insufficiently Active (02/13/2023)   Exercise Vital Sign    Days of Exercise per Week: 2 days    Minutes of Exercise per Session: 30 min  Stress: No Stress Concern Present (02/13/2023)   [employer]  of Occupational Health - Occupational Stress Questionnaire    Feeling of Stress : Not at all  Social Connections: Socially Integrated (02/13/2023)   Social Connection and Isolation Panel    Frequency of Communication with Friends and Family: More than three times a week    Frequency of Social Gatherings with Friends and Family: Three times a week    Attends Religious Services: More than 4 times per year    Active Member of Clubs or Organizations: Yes    Attends Banker Meetings: More than 4 times per year    Marital Status: Married  Catering Manager Violence: Not At Risk (02/13/2023)   Humiliation, Afraid, Rape, and Kick questionnaire    Fear of Current or Ex-Partner: No    Emotionally Abused: No    Physically Abused: No    Sexually Abused: No  Depression (PHQ2-9): Low Risk (09/24/2023)   Depression (PHQ2-9)    PHQ-2 Score: 0  Alcohol Screen: Low Risk (02/13/2023)   Alcohol Screen    Last Alcohol Screening Score (AUDIT): 0  Housing: Low Risk (02/13/2023)   Housing    Last Housing Risk Score: 0  Utilities: Low Risk (09/18/2022)   Received from [facility]   Utilities    In the past 12 months has the electric, gas, oil, or water company threatened to shut off services in your home? : No  Health Literacy: Adequate Health Literacy (02/13/2023)   B1300 Health Literacy    Frequency of need for help with medical instructions: Never    Review of Systems  Skin:  Positive for rash.  All other systems reviewed and are negative.       Objective    BP 120/82   Pulse 78   Ht 5' 5 (1.651 m)   Wt 187 lb (84.8 kg)   SpO2 96%   BMI 31.12 kg/m   Physical Exam Vitals and nursing note reviewed.  Constitutional:      General: She is not in acute distress. Neck:     Thyroid : No thyroid  mass.  Cardiovascular:     Rate and Rhythm: Normal rate and regular rhythm.  Pulmonary:     Effort: Pulmonary effort is normal.     Breath sounds: Normal breath sounds.  Abdominal:      Palpations: Abdomen is soft.     Tenderness: There is no abdominal tenderness.  Musculoskeletal:     Cervical back: Normal range of motion and neck supple.  Skin:    Findings: No rash.  Neurological:  General: No focal deficit present.     Mental Status: She is alert and oriented to person, place, and time.         Assessment & Plan:  1. Rash and nonspecific skin eruption (Primary) Triamcinolone  cream prescribed. Skin care discussed.   2. Type 2 diabetes mellitus without complication, without long-term current use of insulin  (HCC) Appears stable. continue  3. Essential hypertension  Appears stable continue     No follow-ups on file.   Tanda Raguel SQUIBB, MD  "

## 2024-04-04 NOTE — Progress Notes (Unsigned)
" ° ° °  S:     No chief complaint on file.  64 y.o. female who presents for diabetes evaluation, education, and management. PMHx is significant for T2DM, HLD, and is s/p thyroidectomy (right lobectomy for a thyroid  goiter in June 2024). No history of clinical ASCVD, CHF, or CKD.  Patient reports Diabetes was diagnosed in ~2010.   The patient was referred and last seen by Primary Care Provider, Dr. Tanda, on 04/02/2023.  Dr. Tanda did not make any changes. A1c on 12/24/3033 was 7.6% (up from 7.2% previously).  Since [name], PharmD, started seeing her in 10/2022, they have discussed SGLT-2i therapy, along with the role of a GLP-1 RA or dual GLP/GIP agent in diabetes control. She is concerned with side effects with the incretin mimetic agents as well as the SGLT-2i class. 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 Despite counseling concerning GLP-1s and dual GLP/GIP agents, she desires to hold off on starting one of these.   At today's visit, the patient arrives in good spirits and presents without any assistance. She endorses good medication adherence. No symptoms currently.   Family/Social History:  Family History: HTN, heart disease, DM Tobacco: Never smoker  Current diabetes medications include:  Glipizide  5 mg BID Metformin  XR 1000 mg BID Current hypertension medications include:  Amlodipine  5 mg daily Lisinopril  40 mg daily  Current hyperlipidemia medications include:  Atorvastatin  40 mg daily  Insurance coverage: Aetna  Patient denies hypoglycemic events.  Diet:  Diet: watches carbohydrates as much as possible Beverages: water, diet cola, orange juice, apple juice  Exercise:  Limited, watches her grandson, which she has to be active for  Reported home fasting blood sugars:  Fasting glucose: 130-140 mg/dL   Patient denies nocturia (nighttime urination).  Patient denies neuropathy (nerve pain). Patient denies visual changes. Will get eyes examined.  O:  Lab Results   Component Value Date   HGBA1C 7.6 (A) 12/25/2023   Lipid Panel     Component Value Date/Time   CHOL 138 02/15/2023 0922   TRIG 90 02/15/2023 0922   HDL 49 02/15/2023 0922   CHOLHDL 2.8 02/15/2023 0922   LDLCALC 72 02/15/2023 0922   Clinical Atherosclerotic Cardiovascular Disease (ASCVD): No  The 10-year ASCVD risk score (Arnett DK, et al., 2019) is: 13.3%   Values used to calculate the score:     Age: 62 years     Clinically relevant sex: Female     Is Non-Hispanic African American: Yes     Diabetic: Yes     Tobacco smoker: No     Systolic Blood Pressure: 120 mmHg     Is BP treated: Yes     HDL Cholesterol: 49 mg/dL     Total Cholesterol: 138 mg/dL   A/P: Diabetes longstanding currently at goal with an A1c of 7% at today's visit, which is decreased from 7.6% on 12/25/2023. Patient is able to verbalize appropriate hypoglycemia management plan. She is not symptomatic at this time.  Continued medications as prescribed Extensively discussed pathophysiology of diabetes, recommended lifestyle interventions, dietary effects on blood sugar control.  Counseled on s/sx of and management of hypoglycemia.  Due for UACR, obtained today Next A1c due 06/2024  Written patient instructions provided. Patient verbalized understanding of treatment plan.   Follow-up: PCP: 07/09/2024 with Dr. Tanda Pharmacy: 07/2024, patient to schedule follow-up   Woodie Jock, PharmD PGY1 Pharmacy Resident  04/07/2024   "

## 2024-04-05 ENCOUNTER — Other Ambulatory Visit: Payer: Self-pay | Admitting: Family Medicine

## 2024-04-05 DIAGNOSIS — I10 Essential (primary) hypertension: Secondary | ICD-10-CM

## 2024-04-07 ENCOUNTER — Ambulatory Visit: Payer: Self-pay | Admitting: Family Medicine

## 2024-04-07 ENCOUNTER — Ambulatory Visit: Attending: Family Medicine | Admitting: Pharmacist

## 2024-04-07 DIAGNOSIS — Z7984 Long term (current) use of oral hypoglycemic drugs: Secondary | ICD-10-CM

## 2024-04-07 DIAGNOSIS — E119 Type 2 diabetes mellitus without complications: Secondary | ICD-10-CM

## 2024-04-07 LAB — POCT GLYCOSYLATED HEMOGLOBIN (HGB A1C): HbA1c, POC (controlled diabetic range): 7 % (ref 0.0–7.0)

## 2024-04-07 MED ORDER — METFORMIN HCL ER 500 MG PO TB24: 1000.0000 mg | ORAL_TABLET | Freq: Two times a day (BID) | ORAL | 1 refills | Status: AC

## 2024-04-07 NOTE — Telephone Encounter (Signed)
 Requested medications are due for refill today.  yes  Requested medications are on the active medications list.  yes  Last refill. 07/20/2023 #90 1 rf  Future visit scheduled.   yes  Notes to clinic.  Expired labs    Requested Prescriptions  Pending Prescriptions Disp Refills   lisinopril  (ZESTRIL ) 40 MG tablet [Pharmacy Med Name: LISINOPRIL  40 MG TABLET] 90 tablet 1    Sig: TAKE 1 TABLET BY MOUTH EVERY DAY     Cardiovascular:  ACE Inhibitors Failed - 04/07/2024  2:29 PM      Failed - Cr in normal range and within 180 days    Creatinine, Ser  Date Value Ref Range Status  02/15/2023 0.71 0.57 - 1.00 mg/dL Final         Failed - K in normal range and within 180 days    Potassium  Date Value Ref Range Status  02/15/2023 4.7 3.5 - 5.2 mmol/L Final         Passed - Patient is not pregnant      Passed - Last BP in normal range    BP Readings from Last 1 Encounters:  04/01/24 120/82         Passed - Valid encounter within last 6 months    Recent Outpatient Visits           Today Type 2 diabetes mellitus without complication, without long-term current use of insulin  (HCC)   [facility] Fleeta Morris, [place] L, RPH-CPP   6 days ago Rash and nonspecific skin eruption   [facility], Raguel, MD   3 months ago Type 2 diabetes mellitus without complication, without long-term current use of insulin  [facility])   [facility] Fleeta Morris, [place] L, RPH-CPP   3 months ago Type 2 diabetes mellitus without complication, without long-term current use of insulin  [facility])   [facility], Raguel, MD   6 months ago Type 2 diabetes mellitus without complication, without long-term current use of insulin  [facility])   [facility], MD

## 2024-04-08 LAB — MICROALBUMIN / CREATININE URINE RATIO
Creatinine, Urine: 43 mg/dL
Microalb/Creat Ratio: 10 mg/g{creat} (ref 0–29)
Microalbumin, Urine: 4.3 ug/mL

## 2024-07-09 ENCOUNTER — Ambulatory Visit: Payer: Self-pay | Admitting: Family Medicine

## 2024-07-14 ENCOUNTER — Ambulatory Visit: Payer: Self-pay | Admitting: Pharmacist
